# Patient Record
Sex: Male | Born: 1940 | Race: Black or African American | Hispanic: No | Marital: Married
Health system: Southern US, Community
[De-identification: ages and names within clinical notes are randomized; demographics above are authoritative.]

## PROBLEM LIST (undated history)

## (undated) DIAGNOSIS — F419 Anxiety disorder, unspecified: Secondary | ICD-10-CM

## (undated) DIAGNOSIS — I5030 Unspecified diastolic (congestive) heart failure: Secondary | ICD-10-CM

## (undated) DIAGNOSIS — M109 Gout, unspecified: Secondary | ICD-10-CM

## (undated) DIAGNOSIS — K219 Gastro-esophageal reflux disease without esophagitis: Secondary | ICD-10-CM

## (undated) DIAGNOSIS — N189 Chronic kidney disease, unspecified: Secondary | ICD-10-CM

## (undated) DIAGNOSIS — I10 Essential (primary) hypertension: Secondary | ICD-10-CM

## (undated) DIAGNOSIS — E119 Type 2 diabetes mellitus without complications: Secondary | ICD-10-CM

## (undated) DIAGNOSIS — D472 Monoclonal gammopathy: Secondary | ICD-10-CM

## (undated) DIAGNOSIS — D649 Anemia, unspecified: Secondary | ICD-10-CM

---

## 2005-12-29 ENCOUNTER — Emergency Department: Payer: Self-pay | Admitting: Unknown Physician Specialty

## 2006-02-19 ENCOUNTER — Emergency Department: Payer: Self-pay | Admitting: Unknown Physician Specialty

## 2006-02-19 ENCOUNTER — Other Ambulatory Visit: Payer: Self-pay

## 2009-02-19 ENCOUNTER — Emergency Department: Payer: Self-pay | Admitting: Emergency Medicine

## 2010-10-23 ENCOUNTER — Emergency Department: Payer: Self-pay | Admitting: Emergency Medicine

## 2010-11-09 ENCOUNTER — Inpatient Hospital Stay: Payer: Self-pay | Admitting: Internal Medicine

## 2010-11-12 ENCOUNTER — Ambulatory Visit: Payer: Self-pay | Admitting: Oncology

## 2010-12-12 ENCOUNTER — Ambulatory Visit: Payer: Self-pay | Admitting: Oncology

## 2010-12-13 ENCOUNTER — Ambulatory Visit: Payer: Self-pay | Admitting: Family Medicine

## 2010-12-18 ENCOUNTER — Ambulatory Visit: Payer: Self-pay | Admitting: Oncology

## 2010-12-27 ENCOUNTER — Ambulatory Visit: Payer: Self-pay | Admitting: Vascular Surgery

## 2011-01-03 ENCOUNTER — Ambulatory Visit: Payer: Self-pay | Admitting: Vascular Surgery

## 2011-01-12 ENCOUNTER — Ambulatory Visit: Payer: Self-pay | Admitting: Oncology

## 2011-01-29 ENCOUNTER — Ambulatory Visit: Payer: Self-pay | Admitting: Vascular Surgery

## 2011-02-12 ENCOUNTER — Emergency Department: Payer: Self-pay | Admitting: Emergency Medicine

## 2011-03-01 ENCOUNTER — Ambulatory Visit: Payer: Self-pay | Admitting: Oncology

## 2011-03-04 ENCOUNTER — Inpatient Hospital Stay: Payer: Self-pay | Admitting: Internal Medicine

## 2011-03-14 ENCOUNTER — Ambulatory Visit: Payer: Self-pay | Admitting: Oncology

## 2011-04-17 ENCOUNTER — Ambulatory Visit: Payer: Self-pay | Admitting: Internal Medicine

## 2011-06-07 ENCOUNTER — Ambulatory Visit: Payer: Self-pay | Admitting: Rheumatology

## 2011-08-07 ENCOUNTER — Other Ambulatory Visit: Payer: Self-pay | Admitting: Family Medicine

## 2011-08-13 ENCOUNTER — Emergency Department: Payer: Self-pay | Admitting: Emergency Medicine

## 2011-11-25 ENCOUNTER — Ambulatory Visit: Payer: Self-pay | Admitting: Family Medicine

## 2011-12-01 ENCOUNTER — Other Ambulatory Visit: Payer: Self-pay | Admitting: Family Medicine

## 2011-12-01 LAB — BASIC METABOLIC PANEL
BUN: 51 mg/dL — ABNORMAL HIGH (ref 7–18)
Calcium, Total: 8.8 mg/dL (ref 8.5–10.1)
Chloride: 107 mmol/L (ref 98–107)
Co2: 24 mmol/L (ref 21–32)
Creatinine: 2.15 mg/dL — ABNORMAL HIGH (ref 0.60–1.30)
EGFR (African American): 34 — ABNORMAL LOW
EGFR (Non-African Amer.): 30 — ABNORMAL LOW
Osmolality: 299 (ref 275–301)
Potassium: 3.9 mmol/L (ref 3.5–5.1)

## 2011-12-01 LAB — CBC WITH DIFFERENTIAL/PLATELET
Eosinophil #: 0 10*3/uL (ref 0.0–0.7)
Eosinophil %: 0.1 %
HCT: 37.4 % — ABNORMAL LOW (ref 40.0–52.0)
Lymphocyte #: 0.6 10*3/uL — ABNORMAL LOW (ref 1.0–3.6)
Lymphocyte %: 7.3 %
MCH: 29.3 pg (ref 26.0–34.0)
MCV: 93 fL (ref 80–100)
Monocyte %: 10.6 %
Neutrophil #: 6.8 10*3/uL — ABNORMAL HIGH (ref 1.4–6.5)
RBC: 4.02 10*6/uL — ABNORMAL LOW (ref 4.40–5.90)
RDW: 17.2 % — ABNORMAL HIGH (ref 11.5–14.5)
WBC: 8.4 10*3/uL (ref 3.8–10.6)

## 2011-12-22 ENCOUNTER — Other Ambulatory Visit: Payer: Self-pay | Admitting: Family Medicine

## 2011-12-22 LAB — CBC WITH DIFFERENTIAL/PLATELET
Basophil %: 0.5 %
Eosinophil #: 0 10*3/uL (ref 0.0–0.7)
Eosinophil %: 0.2 %
HCT: 38.1 % — ABNORMAL LOW (ref 40.0–52.0)
HGB: 12.4 g/dL — ABNORMAL LOW (ref 13.0–18.0)
Lymphocyte %: 12 %
Monocyte %: 11.7 %
Neutrophil #: 5.7 10*3/uL (ref 1.4–6.5)
Neutrophil %: 75.6 %
Platelet: 254 10*3/uL (ref 150–440)
RDW: 16.8 % — ABNORMAL HIGH (ref 11.5–14.5)
WBC: 7.6 10*3/uL (ref 3.8–10.6)

## 2011-12-22 LAB — RENAL FUNCTION PANEL
Albumin: 3 g/dL — ABNORMAL LOW (ref 3.4–5.0)
Anion Gap: 11 (ref 7–16)
BUN: 76 mg/dL — ABNORMAL HIGH (ref 7–18)
EGFR (African American): 23 — ABNORMAL LOW
EGFR (Non-African Amer.): 20 — ABNORMAL LOW
Glucose: 91 mg/dL (ref 65–99)
Phosphorus: 4.4 mg/dL (ref 2.5–4.9)
Potassium: 4.6 mmol/L (ref 3.5–5.1)
Sodium: 137 mmol/L (ref 136–145)

## 2011-12-26 ENCOUNTER — Emergency Department: Payer: Self-pay | Admitting: Emergency Medicine

## 2011-12-26 LAB — CBC WITH DIFFERENTIAL/PLATELET
Basophil #: 0 10*3/uL (ref 0.0–0.1)
Eosinophil %: 2.5 %
HCT: 36 % — ABNORMAL LOW (ref 40.0–52.0)
HGB: 11.6 g/dL — ABNORMAL LOW (ref 13.0–18.0)
Lymphocyte %: 19.6 %
Monocyte #: 0.6 x10 3/mm (ref 0.2–1.0)
Monocyte %: 10.8 %
Neutrophil #: 3.6 10*3/uL (ref 1.4–6.5)
Neutrophil %: 66.5 %
RBC: 4.03 10*6/uL — ABNORMAL LOW (ref 4.40–5.90)
WBC: 5.4 10*3/uL (ref 3.8–10.6)

## 2011-12-26 LAB — URINALYSIS, COMPLETE
Glucose,UR: NEGATIVE mg/dL (ref 0–75)
Ketone: NEGATIVE
Leukocyte Esterase: NEGATIVE
Nitrite: NEGATIVE
Ph: 5 (ref 4.5–8.0)
Specific Gravity: 1.015 (ref 1.003–1.030)
Squamous Epithelial: 1

## 2011-12-26 LAB — COMPREHENSIVE METABOLIC PANEL
Albumin: 2.8 g/dL — ABNORMAL LOW (ref 3.4–5.0)
Anion Gap: 9 (ref 7–16)
Bilirubin,Total: 0.3 mg/dL (ref 0.2–1.0)
Chloride: 107 mmol/L (ref 98–107)
Creatinine: 3.16 mg/dL — ABNORMAL HIGH (ref 0.60–1.30)
EGFR (Non-African Amer.): 19 — ABNORMAL LOW
Glucose: 104 mg/dL — ABNORMAL HIGH (ref 65–99)
Osmolality: 301 (ref 275–301)

## 2011-12-28 LAB — URINE CULTURE

## 2012-01-12 ENCOUNTER — Ambulatory Visit: Payer: Self-pay | Admitting: Oncology

## 2012-02-03 LAB — COMPREHENSIVE METABOLIC PANEL
Alkaline Phosphatase: 79 U/L (ref 50–136)
Anion Gap: 14 (ref 7–16)
BUN: 103 mg/dL — ABNORMAL HIGH (ref 7–18)
Bilirubin,Total: 1 mg/dL (ref 0.2–1.0)
Creatinine: 4.88 mg/dL — ABNORMAL HIGH (ref 0.60–1.30)
Glucose: 80 mg/dL (ref 65–99)
Osmolality: 309 (ref 275–301)
Potassium: 4.6 mmol/L (ref 3.5–5.1)
SGOT(AST): 26 U/L (ref 15–37)
SGPT (ALT): 14 U/L
Sodium: 139 mmol/L (ref 136–145)

## 2012-02-03 LAB — CBC
MCHC: 30.6 g/dL — ABNORMAL LOW (ref 32.0–36.0)
RBC: 3.7 10*6/uL — ABNORMAL LOW (ref 4.40–5.90)
RDW: 18.6 % — ABNORMAL HIGH (ref 11.5–14.5)

## 2012-02-03 LAB — URINALYSIS, COMPLETE
Bacteria: NONE SEEN
Squamous Epithelial: NONE SEEN
WBC UR: 505 /HPF (ref 0–5)

## 2012-02-03 LAB — CK TOTAL AND CKMB (NOT AT ARMC)
CK, Total: 265 U/L — ABNORMAL HIGH (ref 35–232)
CK-MB: 2.8 ng/mL (ref 0.5–3.6)

## 2012-02-03 LAB — PRO B NATRIURETIC PEPTIDE: B-Type Natriuretic Peptide: 11602 pg/mL — ABNORMAL HIGH (ref 0–125)

## 2012-02-04 ENCOUNTER — Inpatient Hospital Stay: Payer: Self-pay | Admitting: Family Medicine

## 2012-02-04 LAB — TROPONIN I: Troponin-I: 0.07 ng/mL — ABNORMAL HIGH

## 2012-02-04 LAB — CBC WITH DIFFERENTIAL/PLATELET
Eosinophil #: 0 10*3/uL (ref 0.0–0.7)
Eosinophil %: 0.1 %
HCT: 28.9 % — ABNORMAL LOW (ref 40.0–52.0)
HGB: 8.9 g/dL — ABNORMAL LOW (ref 13.0–18.0)
Lymphocyte #: 0.2 10*3/uL — ABNORMAL LOW (ref 1.0–3.6)
Lymphocyte %: 2.2 %
MCHC: 30.6 g/dL — ABNORMAL LOW (ref 32.0–36.0)
MCV: 89 fL (ref 80–100)
Monocyte %: 3 %
Neutrophil %: 94.2 %
Platelet: 108 10*3/uL — ABNORMAL LOW (ref 150–440)
RBC: 3.26 10*6/uL — ABNORMAL LOW (ref 4.40–5.90)
RDW: 18.5 % — ABNORMAL HIGH (ref 11.5–14.5)
WBC: 10.7 10*3/uL — ABNORMAL HIGH (ref 3.8–10.6)

## 2012-02-04 LAB — URINALYSIS, COMPLETE
RBC,UR: 3805 /HPF (ref 0–5)
WBC UR: 2201 /HPF (ref 0–5)

## 2012-02-04 LAB — BASIC METABOLIC PANEL
Anion Gap: 14 (ref 7–16)
BUN: 104 mg/dL — ABNORMAL HIGH (ref 7–18)
Calcium, Total: 7.6 mg/dL — ABNORMAL LOW (ref 8.5–10.1)
Chloride: 107 mmol/L (ref 98–107)
Chloride: 104 mmol/L (ref 98–107)
Co2: 17 mmol/L — ABNORMAL LOW (ref 21–32)
Co2: 24 mmol/L (ref 21–32)
Creatinine: 5.44 mg/dL — ABNORMAL HIGH (ref 0.60–1.30)
EGFR (Non-African Amer.): 10 — ABNORMAL LOW
EGFR (Non-African Amer.): 10 — ABNORMAL LOW
Glucose: 84 mg/dL (ref 65–99)
Glucose: 155 mg/dL — ABNORMAL HIGH (ref 65–99)
Osmolality: 307 (ref 275–301)
Potassium: 5.7 mmol/L — ABNORMAL HIGH (ref 3.5–5.1)
Sodium: 138 mmol/L (ref 136–145)

## 2012-02-04 LAB — TSH: Thyroid Stimulating Horm: 2.4 u[IU]/mL

## 2012-02-05 LAB — MAGNESIUM
Magnesium: 1.5 mg/dL — ABNORMAL LOW
Magnesium: 1.8 mg/dL

## 2012-02-05 LAB — CBC WITH DIFFERENTIAL/PLATELET
Basophil %: 1.1 %
Eosinophil #: 0 10*3/uL (ref 0.0–0.7)
HCT: 31 % — ABNORMAL LOW (ref 40.0–52.0)
MCH: 27.2 pg (ref 26.0–34.0)
MCV: 90 fL (ref 80–100)
Monocyte #: 0.9 x10 3/mm (ref 0.2–1.0)
Neutrophil %: 92.3 %
WBC: 18.1 10*3/uL — ABNORMAL HIGH (ref 3.8–10.6)

## 2012-02-05 LAB — BASIC METABOLIC PANEL
Anion Gap: 7 (ref 7–16)
Anion Gap: 8 (ref 7–16)
Anion Gap: 9 (ref 7–16)
BUN: 48 mg/dL — ABNORMAL HIGH (ref 7–18)
Calcium, Total: 7.9 mg/dL — ABNORMAL LOW (ref 8.5–10.1)
Chloride: 102 mmol/L (ref 98–107)
Chloride: 103 mmol/L (ref 98–107)
Co2: 24 mmol/L (ref 21–32)
Co2: 24 mmol/L (ref 21–32)
Co2: 27 mmol/L (ref 21–32)
Creatinine: 4.47 mg/dL — ABNORMAL HIGH (ref 0.60–1.30)
EGFR (African American): 14 — ABNORMAL LOW
EGFR (African American): 17 — ABNORMAL LOW
EGFR (African American): 23 — ABNORMAL LOW
EGFR (Non-African Amer.): 12 — ABNORMAL LOW
Glucose: 103 mg/dL — ABNORMAL HIGH (ref 65–99)
Glucose: 89 mg/dL (ref 65–99)
Glucose: 99 mg/dL (ref 65–99)
Osmolality: 289 (ref 275–301)
Potassium: 4 mmol/L (ref 3.5–5.1)
Potassium: 4.7 mmol/L (ref 3.5–5.1)
Potassium: 5 mmol/L (ref 3.5–5.1)
Sodium: 134 mmol/L — ABNORMAL LOW (ref 136–145)
Sodium: 136 mmol/L (ref 136–145)
Sodium: 137 mmol/L (ref 136–145)

## 2012-02-05 LAB — LIPID PANEL
HDL Cholesterol: 18 mg/dL — ABNORMAL LOW (ref 40–60)
Ldl Cholesterol, Calc: 41 mg/dL (ref 0–100)
Triglycerides: 157 mg/dL (ref 0–200)
VLDL Cholesterol, Calc: 31 mg/dL (ref 5–40)

## 2012-02-05 LAB — PHOSPHORUS: Phosphorus: 2.5 mg/dL (ref 2.5–4.9)

## 2012-02-06 LAB — CBC WITH DIFFERENTIAL/PLATELET
Basophil #: 0 10*3/uL (ref 0.0–0.1)
Basophil %: 0 %
Eosinophil #: 0.4 10*3/uL (ref 0.0–0.7)
Eosinophil %: 1.9 %
HGB: 9.3 g/dL — ABNORMAL LOW (ref 13.0–18.0)
Lymphocyte #: 0.3 10*3/uL — ABNORMAL LOW (ref 1.0–3.6)
Lymphocyte %: 1.2 %
Monocyte #: 0.7 x10 3/mm (ref 0.2–1.0)
Neutrophil #: 21.3 10*3/uL — ABNORMAL HIGH (ref 1.4–6.5)
Neutrophil %: 93.9 %
RDW: 18.6 % — ABNORMAL HIGH (ref 11.5–14.5)
WBC: 22.7 10*3/uL — ABNORMAL HIGH (ref 3.8–10.6)

## 2012-02-06 LAB — BASIC METABOLIC PANEL
Anion Gap: 7 (ref 7–16)
BUN: 35 mg/dL — ABNORMAL HIGH (ref 7–18)
BUN: 31 mg/dL — ABNORMAL HIGH (ref 7–18)
Calcium, Total: 7.8 mg/dL — ABNORMAL LOW (ref 8.5–10.1)
Co2: 28 mmol/L (ref 21–32)
Creatinine: 2.36 mg/dL — ABNORMAL HIGH (ref 0.60–1.30)
EGFR (Non-African Amer.): 27 — ABNORMAL LOW
EGFR (Non-African Amer.): 30 — ABNORMAL LOW
Osmolality: 286 (ref 275–301)
Potassium: 3.6 mmol/L (ref 3.5–5.1)
Sodium: 138 mmol/L (ref 136–145)
Sodium: 136 mmol/L (ref 136–145)

## 2012-02-06 LAB — FIBRINOGEN: Fibrinogen: 739 mg/dL — ABNORMAL HIGH (ref 210–470)

## 2012-02-06 LAB — PROTIME-INR: Prothrombin Time: 60.3 secs — ABNORMAL HIGH (ref 11.5–14.7)

## 2012-02-06 LAB — PHOSPHORUS: Phosphorus: 1.6 mg/dL — ABNORMAL LOW (ref 2.5–4.9)

## 2012-02-06 LAB — CULTURE, BLOOD (SINGLE)

## 2012-02-06 LAB — URINE CULTURE

## 2012-02-06 LAB — MAGNESIUM
Magnesium: 1.5 mg/dL — ABNORMAL LOW
Magnesium: 1.6 mg/dL — ABNORMAL LOW

## 2012-02-07 LAB — CBC WITH DIFFERENTIAL/PLATELET
Basophil %: 0.3 %
HCT: 30.4 % — ABNORMAL LOW (ref 40.0–52.0)
HGB: 9.4 g/dL — ABNORMAL LOW (ref 13.0–18.0)
Lymphocyte #: 6.6 10*3/uL — ABNORMAL HIGH (ref 1.0–3.6)
Lymphocyte %: 28.9 %
MCH: 27 pg (ref 26.0–34.0)
MCHC: 30.9 g/dL — ABNORMAL LOW (ref 32.0–36.0)
Monocyte #: 0.4 x10 3/mm (ref 0.2–1.0)
Neutrophil #: 15.7 10*3/uL — ABNORMAL HIGH (ref 1.4–6.5)
RBC: 3.49 10*6/uL — ABNORMAL LOW (ref 4.40–5.90)
RDW: 18.8 % — ABNORMAL HIGH (ref 11.5–14.5)
WBC: 23 10*3/uL — ABNORMAL HIGH (ref 3.8–10.6)

## 2012-02-07 LAB — BASIC METABOLIC PANEL
Anion Gap: 8 (ref 7–16)
Anion Gap: 7 (ref 7–16)
Calcium, Total: 8.1 mg/dL — ABNORMAL LOW (ref 8.5–10.1)
Calcium, Total: 8.1 mg/dL — ABNORMAL LOW (ref 8.5–10.1)
Chloride: 101 mmol/L (ref 98–107)
Co2: 27 mmol/L (ref 21–32)
Creatinine: 2.22 mg/dL — ABNORMAL HIGH (ref 0.60–1.30)
Creatinine: 2.04 mg/dL — ABNORMAL HIGH (ref 0.60–1.30)
EGFR (African American): 37 — ABNORMAL LOW
EGFR (Non-African Amer.): 29 — ABNORMAL LOW
EGFR (Non-African Amer.): 32 — ABNORMAL LOW
Glucose: 185 mg/dL — ABNORMAL HIGH (ref 65–99)
Glucose: 165 mg/dL — ABNORMAL HIGH (ref 65–99)
Osmolality: 283 (ref 275–301)
Osmolality: 281 (ref 275–301)
Potassium: 3.7 mmol/L (ref 3.5–5.1)
Sodium: 136 mmol/L (ref 136–145)
Sodium: 135 mmol/L — ABNORMAL LOW (ref 136–145)

## 2012-02-07 LAB — MAGNESIUM
Magnesium: 1.8 mg/dL
Magnesium: 1.8 mg/dL

## 2012-02-07 LAB — PROTIME-INR: INR: 3.9

## 2012-02-08 LAB — COMPREHENSIVE METABOLIC PANEL
Albumin: 1.9 g/dL — ABNORMAL LOW (ref 3.4–5.0)
Alkaline Phosphatase: 208 U/L — ABNORMAL HIGH (ref 50–136)
Anion Gap: 6 — ABNORMAL LOW (ref 7–16)
BUN: 37 mg/dL — ABNORMAL HIGH (ref 7–18)
Bilirubin,Total: 1.9 mg/dL — ABNORMAL HIGH (ref 0.2–1.0)
Creatinine: 2.01 mg/dL — ABNORMAL HIGH (ref 0.60–1.30)
EGFR (African American): 37 — ABNORMAL LOW
EGFR (Non-African Amer.): 32 — ABNORMAL LOW
Glucose: 213 mg/dL — ABNORMAL HIGH (ref 65–99)
Osmolality: 287 (ref 275–301)
Potassium: 3.9 mmol/L (ref 3.5–5.1)
Sodium: 136 mmol/L (ref 136–145)
Total Protein: 5.7 g/dL — ABNORMAL LOW (ref 6.4–8.2)

## 2012-02-08 LAB — CBC WITH DIFFERENTIAL/PLATELET
Basophil #: 0 10*3/uL (ref 0.0–0.1)
Basophil %: 0.1 %
HCT: 27.8 % — ABNORMAL LOW (ref 40.0–52.0)
Lymphocyte %: 3.4 %
MCHC: 31.2 g/dL — ABNORMAL LOW (ref 32.0–36.0)
Monocyte #: 0.7 x10 3/mm (ref 0.2–1.0)
Neutrophil #: 20.4 10*3/uL — ABNORMAL HIGH (ref 1.4–6.5)
Neutrophil %: 93 %
RBC: 3.23 10*6/uL — ABNORMAL LOW (ref 4.40–5.90)
WBC: 21.9 10*3/uL — ABNORMAL HIGH (ref 3.8–10.6)

## 2012-02-08 LAB — MAGNESIUM: Magnesium: 2.1 mg/dL

## 2012-02-08 LAB — VANCOMYCIN, TROUGH: Vancomycin, Trough: 19 ug/mL (ref 10–20)

## 2012-02-08 LAB — PHOSPHORUS: Phosphorus: 1.7 mg/dL — ABNORMAL LOW (ref 2.5–4.9)

## 2012-02-09 LAB — CBC WITH DIFFERENTIAL/PLATELET
Basophil #: 0 10*3/uL (ref 0.0–0.1)
Basophil %: 0.1 %
Eosinophil %: 0.3 %
HCT: 24.8 % — ABNORMAL LOW (ref 40.0–52.0)
HGB: 7.7 g/dL — ABNORMAL LOW (ref 13.0–18.0)
Lymphocyte #: 0.5 10*3/uL — ABNORMAL LOW (ref 1.0–3.6)
Lymphocyte %: 3.1 %
MCH: 26.9 pg (ref 26.0–34.0)
MCHC: 30.9 g/dL — ABNORMAL LOW (ref 32.0–36.0)
MCV: 87 fL (ref 80–100)
Monocyte %: 5.3 %
Neutrophil #: 15.3 10*3/uL — ABNORMAL HIGH (ref 1.4–6.5)
Neutrophil %: 91.2 %
Platelet: 69 10*3/uL — ABNORMAL LOW (ref 150–440)
RBC: 2.85 10*6/uL — ABNORMAL LOW (ref 4.40–5.90)

## 2012-02-09 LAB — BASIC METABOLIC PANEL
BUN: 17 mg/dL (ref 7–18)
Chloride: 99 mmol/L (ref 98–107)
Co2: 31 mmol/L (ref 21–32)
Creatinine: 1.16 mg/dL (ref 0.60–1.30)
EGFR (Non-African Amer.): >60
Glucose: 213 mg/dL — ABNORMAL HIGH (ref 65–99)
Potassium: 3.4 mmol/L — ABNORMAL LOW (ref 3.5–5.1)
Sodium: 139 mmol/L (ref 136–145)

## 2012-02-09 LAB — RENAL FUNCTION PANEL
Albumin: 2.3 g/dL — ABNORMAL LOW (ref 3.4–5.0)
Anion Gap: 8 (ref 7–16)
Chloride: 98 mmol/L (ref 98–107)
EGFR (African American): 33 — ABNORMAL LOW
Glucose: 304 mg/dL — ABNORMAL HIGH (ref 65–99)
Osmolality: 291 (ref 275–301)
Phosphorus: 2.4 mg/dL — ABNORMAL LOW (ref 2.5–4.9)
Potassium: 3.7 mmol/L (ref 3.5–5.1)

## 2012-02-09 LAB — PLATELET COUNT: Platelet: 68 10*3/uL — ABNORMAL LOW (ref 150–440)

## 2012-02-09 LAB — FOLATE: Folic Acid: 4.2 ng/mL (ref 3.1–100.0)

## 2012-02-09 LAB — IRON AND TIBC
Iron Bind.Cap.(Total): 169 ug/dL — ABNORMAL LOW (ref 250–450)
Iron: 56 ug/dL — ABNORMAL LOW (ref 65–175)
Unbound Iron-Bind.Cap.: 113 ug/dL

## 2012-02-09 LAB — FERRITIN: Ferritin (ARMC): 278 ng/mL (ref 8–388)

## 2012-02-09 LAB — PROTIME-INR: INR: 1.2

## 2012-02-10 LAB — BASIC METABOLIC PANEL
Anion Gap: 7 (ref 7–16)
Calcium, Total: 8 mg/dL — ABNORMAL LOW (ref 8.5–10.1)
EGFR (African American): 27 — ABNORMAL LOW
EGFR (Non-African Amer.): 23 — ABNORMAL LOW
Glucose: 212 mg/dL — ABNORMAL HIGH (ref 65–99)
Potassium: 3.5 mmol/L (ref 3.5–5.1)

## 2012-02-10 LAB — CBC WITH DIFFERENTIAL/PLATELET
Basophil #: 0 10*3/uL (ref 0.0–0.1)
Basophil %: 0.1 %
Eosinophil %: 0 %
HCT: 25.9 % — ABNORMAL LOW (ref 40.0–52.0)
HGB: 8.1 g/dL — ABNORMAL LOW (ref 13.0–18.0)
Lymphocyte %: 4 %
MCHC: 31.3 g/dL — ABNORMAL LOW (ref 32.0–36.0)
MCV: 87 fL (ref 80–100)
Monocyte #: 1.3 x10 3/mm — ABNORMAL HIGH (ref 0.2–1.0)
Neutrophil %: 87.7 %
RBC: 2.98 10*6/uL — ABNORMAL LOW (ref 4.40–5.90)
WBC: 16.4 10*3/uL — ABNORMAL HIGH (ref 3.8–10.6)

## 2012-02-10 LAB — PHOSPHORUS: Phosphorus: 3.1 mg/dL (ref 2.5–4.9)

## 2012-02-10 LAB — ALBUMIN: Albumin: 2.6 g/dL — ABNORMAL LOW (ref 3.4–5.0)

## 2012-02-11 ENCOUNTER — Ambulatory Visit: Payer: Self-pay | Admitting: Oncology

## 2012-02-11 LAB — COMPREHENSIVE METABOLIC PANEL
Anion Gap: 7 (ref 7–16)
BUN: 84 mg/dL — ABNORMAL HIGH (ref 7–18)
Bilirubin,Total: 0.8 mg/dL (ref 0.2–1.0)
Creatinine: 3.45 mg/dL — ABNORMAL HIGH (ref 0.60–1.30)
EGFR (African American): 19 — ABNORMAL LOW
Glucose: 207 mg/dL — ABNORMAL HIGH (ref 65–99)
SGPT (ALT): 23 U/L
Sodium: 139 mmol/L (ref 136–145)
Total Protein: 5.8 g/dL — ABNORMAL LOW (ref 6.4–8.2)

## 2012-02-11 LAB — CBC WITH DIFFERENTIAL/PLATELET
Basophil %: 0 %
HCT: 25.9 % — ABNORMAL LOW (ref 40.0–52.0)
HGB: 8.1 g/dL — ABNORMAL LOW (ref 13.0–18.0)
Lymphocyte %: 4.4 %
MCHC: 31.4 g/dL — ABNORMAL LOW (ref 32.0–36.0)
Monocyte %: 5.7 %
Neutrophil #: 13.8 10*3/uL — ABNORMAL HIGH (ref 1.4–6.5)
Neutrophil %: 89.8 %
RBC: 2.98 10*6/uL — ABNORMAL LOW (ref 4.40–5.90)
WBC: 15.4 10*3/uL — ABNORMAL HIGH (ref 3.8–10.6)

## 2012-02-12 LAB — BASIC METABOLIC PANEL
Calcium, Total: 8 mg/dL — ABNORMAL LOW (ref 8.5–10.1)
Co2: 31 mmol/L (ref 21–32)
EGFR (Non-African Amer.): 18 — ABNORMAL LOW
Glucose: 279 mg/dL — ABNORMAL HIGH (ref 65–99)
Osmolality: 308 (ref 275–301)
Potassium: 4 mmol/L (ref 3.5–5.1)
Sodium: 138 mmol/L (ref 136–145)

## 2012-02-12 LAB — CBC WITH DIFFERENTIAL/PLATELET
Basophil %: 0.1 %
Eosinophil #: 0 10*3/uL (ref 0.0–0.7)
HCT: 25 % — ABNORMAL LOW (ref 40.0–52.0)
HGB: 7.8 g/dL — ABNORMAL LOW (ref 13.0–18.0)
Lymphocyte #: 0.8 10*3/uL — ABNORMAL LOW (ref 1.0–3.6)
Lymphocyte %: 5.7 %
MCHC: 31.3 g/dL — ABNORMAL LOW (ref 32.0–36.0)
MCV: 87 fL (ref 80–100)
Neutrophil #: 12.4 10*3/uL — ABNORMAL HIGH (ref 1.4–6.5)

## 2012-02-13 LAB — CBC WITH DIFFERENTIAL/PLATELET
Basophil %: 0.2 %
Eosinophil #: 0 10*3/uL (ref 0.0–0.7)
Eosinophil %: 0.4 %
HCT: 25.4 % — ABNORMAL LOW (ref 40.0–52.0)
HGB: 8 g/dL — ABNORMAL LOW (ref 13.0–18.0)
Lymphocyte %: 10.8 %
MCHC: 31.5 g/dL — ABNORMAL LOW (ref 32.0–36.0)
Monocyte %: 8.3 %
Neutrophil #: 7.7 10*3/uL — ABNORMAL HIGH (ref 1.4–6.5)
Neutrophil %: 80.3 %
RBC: 2.9 10*6/uL — ABNORMAL LOW (ref 4.40–5.90)

## 2012-02-13 LAB — BASIC METABOLIC PANEL
Anion Gap: 9 (ref 7–16)
Calcium, Total: 7.9 mg/dL — ABNORMAL LOW (ref 8.5–10.1)
Co2: 30 mmol/L (ref 21–32)
EGFR (African American): 16 — ABNORMAL LOW
EGFR (Non-African Amer.): 14 — ABNORMAL LOW
Sodium: 137 mmol/L (ref 136–145)

## 2012-02-14 LAB — BASIC METABOLIC PANEL
Chloride: 97 mmol/L — ABNORMAL LOW (ref 98–107)
Co2: 31 mmol/L (ref 21–32)
Creatinine: 3.68 mg/dL — ABNORMAL HIGH (ref 0.60–1.30)
Potassium: 4.2 mmol/L (ref 3.5–5.1)
Sodium: 136 mmol/L (ref 136–145)

## 2012-02-14 LAB — CBC WITH DIFFERENTIAL/PLATELET
Eosinophil %: 0.2 %
MCV: 88 fL (ref 80–100)
Monocyte %: 8.2 %
Neutrophil %: 78.7 %
Platelet: 188 10*3/uL (ref 150–440)
RBC: 2.69 10*6/uL — ABNORMAL LOW (ref 4.40–5.90)
WBC: 9.1 10*3/uL (ref 3.8–10.6)

## 2012-02-15 LAB — CBC WITH DIFFERENTIAL/PLATELET
Basophil %: 0.1 %
HCT: 23.5 % — ABNORMAL LOW (ref 40.0–52.0)
HGB: 7.6 g/dL — ABNORMAL LOW (ref 13.0–18.0)
Lymphocyte #: 0.9 10*3/uL — ABNORMAL LOW (ref 1.0–3.6)
Lymphocyte %: 10.4 %
MCH: 27.9 pg (ref 26.0–34.0)
MCHC: 32.4 g/dL (ref 32.0–36.0)
MCV: 86 fL (ref 80–100)
Neutrophil #: 7.1 10*3/uL — ABNORMAL HIGH (ref 1.4–6.5)
RDW: 18.6 % — ABNORMAL HIGH (ref 11.5–14.5)

## 2012-02-15 LAB — COMPREHENSIVE METABOLIC PANEL
Alkaline Phosphatase: 99 U/L (ref 50–136)
Anion Gap: 11 (ref 7–16)
Bilirubin,Total: 1.1 mg/dL — ABNORMAL HIGH (ref 0.2–1.0)
Calcium, Total: 8.1 mg/dL — ABNORMAL LOW (ref 8.5–10.1)
Chloride: 96 mmol/L — ABNORMAL LOW (ref 98–107)
Co2: 28 mmol/L (ref 21–32)
EGFR (African American): 15 — ABNORMAL LOW
EGFR (Non-African Amer.): 13 — ABNORMAL LOW
Osmolality: 293 (ref 275–301)
Sodium: 135 mmol/L — ABNORMAL LOW (ref 136–145)

## 2012-02-16 LAB — BASIC METABOLIC PANEL
Anion Gap: 6 — ABNORMAL LOW (ref 7–16)
BUN: 56 mg/dL — ABNORMAL HIGH (ref 7–18)
Co2: 31 mmol/L (ref 21–32)
Creatinine: 3.65 mg/dL — ABNORMAL HIGH (ref 0.60–1.30)
EGFR (African American): 18 — ABNORMAL LOW
EGFR (Non-African Amer.): 16 — ABNORMAL LOW
Glucose: 109 mg/dL — ABNORMAL HIGH (ref 65–99)
Sodium: 135 mmol/L — ABNORMAL LOW (ref 136–145)

## 2012-02-16 LAB — CBC WITH DIFFERENTIAL/PLATELET
Basophil #: 0 10*3/uL (ref 0.0–0.1)
Eosinophil %: 0.7 %
Lymphocyte #: 1 10*3/uL (ref 1.0–3.6)
MCH: 27.1 pg (ref 26.0–34.0)
Neutrophil #: 5.8 10*3/uL (ref 1.4–6.5)
Neutrophil %: 76.3 %
Platelet: 249 10*3/uL (ref 150–440)
RBC: 2.99 10*6/uL — ABNORMAL LOW (ref 4.40–5.90)
RDW: 18.8 % — ABNORMAL HIGH (ref 11.5–14.5)

## 2012-02-17 LAB — CLOSTRIDIUM DIFFICILE BY PCR

## 2012-02-17 LAB — CBC WITH DIFFERENTIAL/PLATELET
Basophil %: 0.5 %
Eosinophil %: 0.9 %
HCT: 25.2 % — ABNORMAL LOW (ref 40.0–52.0)
HGB: 8.1 g/dL — ABNORMAL LOW (ref 13.0–18.0)
Lymphocyte %: 13.3 %
MCH: 27.2 pg (ref 26.0–34.0)
Neutrophil #: 5.7 10*3/uL (ref 1.4–6.5)
Neutrophil %: 75.8 %
RBC: 2.96 10*6/uL — ABNORMAL LOW (ref 4.40–5.90)

## 2012-02-17 LAB — BASIC METABOLIC PANEL
Calcium, Total: 7.9 mg/dL — ABNORMAL LOW (ref 8.5–10.1)
Chloride: 98 mmol/L (ref 98–107)
Glucose: 145 mg/dL — ABNORMAL HIGH (ref 65–99)
Osmolality: 293 (ref 275–301)
Potassium: 3.6 mmol/L (ref 3.5–5.1)

## 2012-02-18 LAB — CBC WITH DIFFERENTIAL/PLATELET
Basophil #: 0 10*3/uL (ref 0.0–0.1)
HGB: 7.8 g/dL — ABNORMAL LOW (ref 13.0–18.0)
Lymphocyte %: 11.4 %
MCV: 86 fL (ref 80–100)
Monocyte %: 7.9 %
Platelet: 281 10*3/uL (ref 150–440)
RBC: 2.86 10*6/uL — ABNORMAL LOW (ref 4.40–5.90)
RDW: 18.3 % — ABNORMAL HIGH (ref 11.5–14.5)

## 2012-02-18 LAB — BASIC METABOLIC PANEL
BUN: 72 mg/dL — ABNORMAL HIGH (ref 7–18)
Chloride: 99 mmol/L (ref 98–107)
Creatinine: 4.18 mg/dL — ABNORMAL HIGH (ref 0.60–1.30)
EGFR (Non-African Amer.): 13 — ABNORMAL LOW
Glucose: 115 mg/dL — ABNORMAL HIGH (ref 65–99)
Potassium: 3.1 mmol/L — ABNORMAL LOW (ref 3.5–5.1)
Sodium: 136 mmol/L (ref 136–145)

## 2012-02-18 LAB — CREATININE CLEARANCE, URINE, 24 HOUR: Total Volume: 1550 mL

## 2012-02-19 LAB — CBC WITH DIFFERENTIAL/PLATELET
Basophil %: 0.4 %
Eosinophil #: 0 10*3/uL (ref 0.0–0.7)
Eosinophil %: 0.5 %
HCT: 24.6 % — ABNORMAL LOW (ref 40.0–52.0)
HGB: 7.9 g/dL — ABNORMAL LOW (ref 13.0–18.0)
MCH: 27.5 pg (ref 26.0–34.0)
MCHC: 32.2 g/dL (ref 32.0–36.0)
Monocyte #: 0.7 x10 3/mm (ref 0.2–1.0)
Neutrophil #: 6.8 10*3/uL — ABNORMAL HIGH (ref 1.4–6.5)
RBC: 2.88 10*6/uL — ABNORMAL LOW (ref 4.40–5.90)
WBC: 8.8 10*3/uL (ref 3.8–10.6)

## 2012-02-19 LAB — BASIC METABOLIC PANEL
Anion Gap: 14 (ref 7–16)
Calcium, Total: 8.2 mg/dL — ABNORMAL LOW (ref 8.5–10.1)
Creatinine: 4 mg/dL — ABNORMAL HIGH (ref 0.60–1.30)
EGFR (African American): 16 — ABNORMAL LOW
EGFR (Non-African Amer.): 14 — ABNORMAL LOW
Glucose: 113 mg/dL — ABNORMAL HIGH (ref 65–99)
Sodium: 138 mmol/L (ref 136–145)

## 2012-02-19 LAB — PROTIME-INR
INR: 1.2
Prothrombin Time: 15.6 secs — ABNORMAL HIGH (ref 11.5–14.7)

## 2012-02-19 LAB — PHOSPHORUS: Phosphorus: 5.6 mg/dL — ABNORMAL HIGH (ref 2.5–4.9)

## 2012-02-20 LAB — CBC WITH DIFFERENTIAL/PLATELET
Basophil #: 0 10*3/uL (ref 0.0–0.1)
Eosinophil %: 0.5 %
HGB: 7.3 g/dL — ABNORMAL LOW (ref 13.0–18.0)
Lymphocyte #: 1.9 10*3/uL (ref 1.0–3.6)
Lymphocyte %: 24.1 %
MCH: 27.2 pg (ref 26.0–34.0)
Monocyte #: 0.8 x10 3/mm (ref 0.2–1.0)
Neutrophil %: 64.5 %
Platelet: 226 10*3/uL (ref 150–440)
RBC: 2.7 10*6/uL — ABNORMAL LOW (ref 4.40–5.90)
WBC: 8 10*3/uL (ref 3.8–10.6)

## 2012-02-20 LAB — BASIC METABOLIC PANEL
Anion Gap: 10 (ref 7–16)
BUN: 54 mg/dL — ABNORMAL HIGH (ref 7–18)
Calcium, Total: 7.8 mg/dL — ABNORMAL LOW (ref 8.5–10.1)
Chloride: 101 mmol/L (ref 98–107)
Co2: 30 mmol/L (ref 21–32)
Glucose: 98 mg/dL (ref 65–99)
Osmolality: 296 (ref 275–301)
Potassium: 3.4 mmol/L — ABNORMAL LOW (ref 3.5–5.1)

## 2012-02-21 LAB — CBC WITH DIFFERENTIAL/PLATELET
Basophil %: 0.8 %
Eosinophil #: 0 10*3/uL (ref 0.0–0.7)
Eosinophil %: 0.6 %
HGB: 7.6 g/dL — ABNORMAL LOW (ref 13.0–18.0)
MCH: 27.5 pg (ref 26.0–34.0)
MCHC: 31.6 g/dL — ABNORMAL LOW (ref 32.0–36.0)
MCV: 87 fL (ref 80–100)
Monocyte #: 0.6 x10 3/mm (ref 0.2–1.0)
Monocyte %: 10.5 %
Neutrophil %: 70.5 %
Platelet: 230 10*3/uL (ref 150–440)
RBC: 2.75 10*6/uL — ABNORMAL LOW (ref 4.40–5.90)
WBC: 6.2 10*3/uL (ref 3.8–10.6)

## 2012-02-21 LAB — BASIC METABOLIC PANEL
Anion Gap: 11 (ref 7–16)
BUN: 58 mg/dL — ABNORMAL HIGH (ref 7–18)
Calcium, Total: 8.4 mg/dL — ABNORMAL LOW (ref 8.5–10.1)
Chloride: 100 mmol/L (ref 98–107)
Co2: 29 mmol/L (ref 21–32)
EGFR (African American): 18 — ABNORMAL LOW
Potassium: 3.1 mmol/L — ABNORMAL LOW (ref 3.5–5.1)
Sodium: 140 mmol/L (ref 136–145)

## 2012-02-21 LAB — POTASSIUM: Potassium: 3 mmol/L — ABNORMAL LOW (ref 3.5–5.1)

## 2012-02-21 LAB — PHOSPHORUS: Phosphorus: 5.1 mg/dL — ABNORMAL HIGH (ref 2.5–4.9)

## 2012-02-21 LAB — MAGNESIUM: Magnesium: 1.8 mg/dL

## 2012-02-22 LAB — BASIC METABOLIC PANEL
BUN: 36 mg/dL — ABNORMAL HIGH (ref 7–18)
Calcium, Total: 8.1 mg/dL — ABNORMAL LOW (ref 8.5–10.1)
Creatinine: 2.75 mg/dL — ABNORMAL HIGH (ref 0.60–1.30)
EGFR (African American): 26 — ABNORMAL LOW
EGFR (Non-African Amer.): 22 — ABNORMAL LOW
Glucose: 102 mg/dL — ABNORMAL HIGH (ref 65–99)
Sodium: 143 mmol/L (ref 136–145)

## 2012-02-22 LAB — CBC WITH DIFFERENTIAL/PLATELET
Basophil #: 0 10*3/uL (ref 0.0–0.1)
Basophil %: 0.5 %
Eosinophil #: 0 10*3/uL (ref 0.0–0.7)
Eosinophil %: 0.7 %
HCT: 24.2 % — ABNORMAL LOW (ref 40.0–52.0)
HGB: 7.8 g/dL — ABNORMAL LOW (ref 13.0–18.0)
MCH: 27.7 pg (ref 26.0–34.0)
MCHC: 32.1 g/dL (ref 32.0–36.0)
Monocyte #: 0.6 x10 3/mm (ref 0.2–1.0)
Neutrophil #: 4.2 10*3/uL (ref 1.4–6.5)
Neutrophil %: 74.8 %
RDW: 17.5 % — ABNORMAL HIGH (ref 11.5–14.5)

## 2012-02-23 LAB — CBC WITH DIFFERENTIAL/PLATELET
Basophil #: 0 10*3/uL (ref 0.0–0.1)
Basophil %: 0.4 %
Eosinophil #: 0.1 10*3/uL (ref 0.0–0.7)
Eosinophil %: 1.2 %
HGB: 7.8 g/dL — ABNORMAL LOW (ref 13.0–18.0)
Lymphocyte #: 0.7 10*3/uL — ABNORMAL LOW (ref 1.0–3.6)
Lymphocyte %: 10 %
MCH: 26.8 pg (ref 26.0–34.0)
MCHC: 31.2 g/dL — ABNORMAL LOW (ref 32.0–36.0)
Monocyte #: 0.5 x10 3/mm (ref 0.2–1.0)
Neutrophil %: 80.3 %
Platelet: 196 10*3/uL (ref 150–440)
RBC: 2.91 10*6/uL — ABNORMAL LOW (ref 4.40–5.90)
RDW: 17.4 % — ABNORMAL HIGH (ref 11.5–14.5)

## 2012-02-23 LAB — BASIC METABOLIC PANEL
Calcium, Total: 8.1 mg/dL — ABNORMAL LOW (ref 8.5–10.1)
Chloride: 100 mmol/L (ref 98–107)
EGFR (African American): 18 — ABNORMAL LOW
EGFR (Non-African Amer.): 15 — ABNORMAL LOW
Glucose: 171 mg/dL — ABNORMAL HIGH (ref 65–99)
Osmolality: 296 (ref 275–301)
Potassium: 2.8 mmol/L — ABNORMAL LOW (ref 3.5–5.1)

## 2012-02-23 LAB — URINALYSIS, COMPLETE
Bilirubin,UR: NEGATIVE
Glucose,UR: NEGATIVE mg/dL (ref 0–75)
Hyaline Cast: 3
Ketone: NEGATIVE
Nitrite: NEGATIVE
Ph: 5 (ref 4.5–8.0)
RBC,UR: 101 /HPF (ref 0–5)
WBC UR: 430 /HPF (ref 0–5)

## 2012-02-23 LAB — PHOSPHORUS: Phosphorus: 2.2 mg/dL — ABNORMAL LOW (ref 2.5–4.9)

## 2012-02-24 LAB — BASIC METABOLIC PANEL
Anion Gap: 10 (ref 7–16)
BUN: 29 mg/dL — ABNORMAL HIGH (ref 7–18)
Chloride: 99 mmol/L (ref 98–107)
Co2: 31 mmol/L (ref 21–32)
Creatinine: 2.57 mg/dL — ABNORMAL HIGH (ref 0.60–1.30)
EGFR (African American): 28 — ABNORMAL LOW
EGFR (Non-African Amer.): 24 — ABNORMAL LOW
Glucose: 159 mg/dL — ABNORMAL HIGH (ref 65–99)
Osmolality: 289 (ref 275–301)
Sodium: 140 mmol/L (ref 136–145)

## 2012-02-24 LAB — CBC WITH DIFFERENTIAL/PLATELET
Basophil %: 0.6 %
Eosinophil #: 0.1 10*3/uL (ref 0.0–0.7)
Eosinophil %: 1.2 %
HGB: 7.9 g/dL — ABNORMAL LOW (ref 13.0–18.0)
Lymphocyte %: 14.8 %
MCHC: 31.2 g/dL — ABNORMAL LOW (ref 32.0–36.0)
Monocyte %: 6.8 %
Neutrophil #: 5.8 10*3/uL (ref 1.4–6.5)
Neutrophil %: 76.6 %
RBC: 2.95 10*6/uL — ABNORMAL LOW (ref 4.40–5.90)

## 2012-02-25 LAB — BASIC METABOLIC PANEL
Anion Gap: 9 (ref 7–16)
BUN: 38 mg/dL — ABNORMAL HIGH (ref 7–18)
Calcium, Total: 8 mg/dL — ABNORMAL LOW (ref 8.5–10.1)
Co2: 31 mmol/L (ref 21–32)
Creatinine: 3.19 mg/dL — ABNORMAL HIGH (ref 0.60–1.30)
EGFR (African American): 21 — ABNORMAL LOW
EGFR (Non-African Amer.): 18 — ABNORMAL LOW
Glucose: 139 mg/dL — ABNORMAL HIGH (ref 65–99)
Osmolality: 289 (ref 275–301)
Potassium: 4.1 mmol/L (ref 3.5–5.1)

## 2012-02-25 LAB — CBC WITH DIFFERENTIAL/PLATELET
Basophil #: 0 10*3/uL (ref 0.0–0.1)
Basophil %: 0.5 %
Eosinophil #: 0.1 10*3/uL (ref 0.0–0.7)
HCT: 23.5 % — ABNORMAL LOW (ref 40.0–52.0)
HGB: 7.4 g/dL — ABNORMAL LOW (ref 13.0–18.0)
Lymphocyte #: 1.3 10*3/uL (ref 1.0–3.6)
MCH: 27 pg (ref 26.0–34.0)
MCHC: 31.3 g/dL — ABNORMAL LOW (ref 32.0–36.0)
Neutrophil #: 7.1 10*3/uL — ABNORMAL HIGH (ref 1.4–6.5)
Platelet: 210 10*3/uL (ref 150–440)
RBC: 2.72 10*6/uL — ABNORMAL LOW (ref 4.40–5.90)
RDW: 17.4 % — ABNORMAL HIGH (ref 11.5–14.5)
WBC: 9.1 10*3/uL (ref 3.8–10.6)

## 2012-02-26 LAB — CBC WITH DIFFERENTIAL/PLATELET
Basophil #: 0 10*3/uL (ref 0.0–0.1)
Basophil %: 0.5 %
Eosinophil %: 1.5 %
HGB: 7.2 g/dL — ABNORMAL LOW (ref 13.0–18.0)
Lymphocyte #: 1.5 10*3/uL (ref 1.0–3.6)
MCH: 26.9 pg (ref 26.0–34.0)
MCHC: 31.5 g/dL — ABNORMAL LOW (ref 32.0–36.0)
MCV: 85 fL (ref 80–100)
Monocyte #: 0.6 x10 3/mm (ref 0.2–1.0)
Neutrophil #: 7.7 10*3/uL — ABNORMAL HIGH (ref 1.4–6.5)
RBC: 2.7 10*6/uL — ABNORMAL LOW (ref 4.40–5.90)
RDW: 17.9 % — ABNORMAL HIGH (ref 11.5–14.5)
WBC: 10 10*3/uL (ref 3.8–10.6)

## 2012-02-27 LAB — BASIC METABOLIC PANEL
Anion Gap: 7 (ref 7–16)
Calcium, Total: 8.1 mg/dL — ABNORMAL LOW (ref 8.5–10.1)
Chloride: 100 mmol/L (ref 98–107)
EGFR (Non-African Amer.): 23 — ABNORMAL LOW
Glucose: 145 mg/dL — ABNORMAL HIGH (ref 65–99)
Osmolality: 288 (ref 275–301)
Potassium: 3.6 mmol/L (ref 3.5–5.1)

## 2012-02-27 LAB — CBC WITH DIFFERENTIAL/PLATELET
Basophil %: 0.3 %
Eosinophil %: 1.6 %
HCT: 23.9 % — ABNORMAL LOW (ref 40.0–52.0)
HGB: 7.3 g/dL — ABNORMAL LOW (ref 13.0–18.0)
Lymphocyte #: 1.1 10*3/uL (ref 1.0–3.6)
MCHC: 30.5 g/dL — ABNORMAL LOW (ref 32.0–36.0)
MCV: 86 fL (ref 80–100)
Monocyte %: 7.4 %
Neutrophil #: 6 10*3/uL (ref 1.4–6.5)
RBC: 2.76 10*6/uL — ABNORMAL LOW (ref 4.40–5.90)
WBC: 7.8 10*3/uL (ref 3.8–10.6)

## 2012-02-27 LAB — CULTURE, BLOOD (SINGLE)

## 2012-02-28 LAB — CBC WITH DIFFERENTIAL/PLATELET
Basophil #: 0 10*3/uL (ref 0.0–0.1)
Basophil %: 0.4 %
Eosinophil %: 1.7 %
Lymphocyte %: 15.8 %
MCH: 26.5 pg (ref 26.0–34.0)
MCV: 85 fL (ref 80–100)
Monocyte #: 0.7 x10 3/mm (ref 0.2–1.0)
Monocyte %: 8 %
Neutrophil #: 6.1 10*3/uL (ref 1.4–6.5)
Platelet: 310 10*3/uL (ref 150–440)
RBC: 2.54 10*6/uL — ABNORMAL LOW (ref 4.40–5.90)

## 2012-02-29 LAB — RENAL FUNCTION PANEL
Albumin: 2 g/dL — ABNORMAL LOW (ref 3.4–5.0)
Anion Gap: 7 (ref 7–16)
BUN: 39 mg/dL — ABNORMAL HIGH (ref 7–18)
Calcium, Total: 8.1 mg/dL — ABNORMAL LOW (ref 8.5–10.1)
EGFR (African American): 28 — ABNORMAL LOW
Glucose: 127 mg/dL — ABNORMAL HIGH (ref 65–99)
Osmolality: 289 (ref 275–301)
Phosphorus: 1.1 mg/dL — ABNORMAL LOW (ref 2.5–4.9)
Potassium: 4 mmol/L (ref 3.5–5.1)

## 2012-02-29 LAB — CBC WITH DIFFERENTIAL/PLATELET
Basophil %: 0.4 %
Eosinophil %: 2.1 %
HCT: 22.7 % — ABNORMAL LOW (ref 40.0–52.0)
HGB: 7.1 g/dL — ABNORMAL LOW (ref 13.0–18.0)
Lymphocyte %: 21.9 %
MCHC: 31.2 g/dL — ABNORMAL LOW (ref 32.0–36.0)
MCV: 85 fL (ref 80–100)
Monocyte #: 0.7 x10 3/mm (ref 0.2–1.0)
Monocyte %: 9.3 %
Neutrophil %: 66.3 %
RBC: 2.66 10*6/uL — ABNORMAL LOW (ref 4.40–5.90)
WBC: 7.6 10*3/uL (ref 3.8–10.6)

## 2012-02-29 LAB — PHOSPHORUS: Phosphorus: 1.2 mg/dL — ABNORMAL LOW (ref 2.5–4.9)

## 2012-02-29 LAB — ALBUMIN: Albumin: 2 g/dL — ABNORMAL LOW (ref 3.4–5.0)

## 2012-03-01 ENCOUNTER — Ambulatory Visit (HOSPITAL_COMMUNITY)
Admission: AD | Admit: 2012-03-01 | Discharge: 2012-03-01 | Disposition: A | Discharge status: Home / Self Care | Payer: Medicare Other | Source: Other Acute Inpatient Hospital | Attending: Internal Medicine | Admitting: Internal Medicine

## 2012-03-01 ENCOUNTER — Inpatient Hospital Stay
Admission: EM | Admit: 2012-03-01 | Discharge: 2012-06-10 | Disposition: A | Discharge status: Skilled Nursing Facility | Payer: Medicare Other | Source: Ambulatory Visit | Attending: Internal Medicine | Admitting: Internal Medicine

## 2012-03-01 DIAGNOSIS — I509 Heart failure, unspecified: Secondary | ICD-10-CM

## 2012-03-01 DIAGNOSIS — Z930 Tracheostomy status: Secondary | ICD-10-CM

## 2012-03-01 DIAGNOSIS — J9600 Acute respiratory failure, unspecified whether with hypoxia or hypercapnia: Secondary | ICD-10-CM | POA: Insufficient documentation

## 2012-03-01 DIAGNOSIS — J189 Pneumonia, unspecified organism: Secondary | ICD-10-CM

## 2012-03-01 DIAGNOSIS — J9601 Acute respiratory failure with hypoxia: Secondary | ICD-10-CM

## 2012-03-01 DIAGNOSIS — A419 Sepsis, unspecified organism: Secondary | ICD-10-CM | POA: Insufficient documentation

## 2012-03-01 DIAGNOSIS — N289 Disorder of kidney and ureter, unspecified: Secondary | ICD-10-CM | POA: Insufficient documentation

## 2012-03-01 HISTORY — DX: Anxiety disorder, unspecified: F41.9

## 2012-03-01 HISTORY — DX: Type 2 diabetes mellitus without complications: E11.9

## 2012-03-01 HISTORY — DX: Gout, unspecified: M10.9

## 2012-03-01 HISTORY — DX: Chronic kidney disease, unspecified: N18.9

## 2012-03-01 HISTORY — DX: Unspecified diastolic (congestive) heart failure: I50.30

## 2012-03-01 HISTORY — DX: Gastro-esophageal reflux disease without esophagitis: K21.9

## 2012-03-01 HISTORY — DX: Anemia, unspecified: D64.9

## 2012-03-01 HISTORY — DX: Essential (primary) hypertension: I10

## 2012-03-01 HISTORY — DX: Monoclonal gammopathy: D47.2

## 2012-03-01 LAB — RENAL FUNCTION PANEL
Albumin: 2.1 g/dL — ABNORMAL LOW (ref 3.4–5.0)
Anion Gap: 8 (ref 7–16)
BUN: 30 mg/dL — ABNORMAL HIGH (ref 7–18)
Chloride: 98 mmol/L (ref 98–107)
Creatinine: 1.98 mg/dL — ABNORMAL HIGH (ref 0.60–1.30)
EGFR (African American): 38 — ABNORMAL LOW
EGFR (Non-African Amer.): 33 — ABNORMAL LOW
Glucose: 142 mg/dL — ABNORMAL HIGH (ref 65–99)
Osmolality: 282 (ref 275–301)
Phosphorus: 1.4 mg/dL — ABNORMAL LOW (ref 2.5–4.9)
Potassium: 4.1 mmol/L (ref 3.5–5.1)

## 2012-03-01 LAB — CBC WITH DIFFERENTIAL/PLATELET
Basophil #: 0.1 10*3/uL (ref 0.0–0.1)
Eosinophil #: 0.1 10*3/uL (ref 0.0–0.7)
HCT: 24.9 % — ABNORMAL LOW (ref 40.0–52.0)
Lymphocyte %: 20.8 %
MCH: 26 pg (ref 26.0–34.0)
Monocyte #: 0.7 x10 3/mm (ref 0.2–1.0)
Monocyte %: 8 %
Neutrophil %: 68.8 %
Platelet: 337 10*3/uL (ref 150–440)
RDW: 17.7 % — ABNORMAL HIGH (ref 11.5–14.5)
WBC: 8.7 10*3/uL (ref 3.8–10.6)

## 2012-03-02 LAB — COMPREHENSIVE METABOLIC PANEL
ALT: 6 U/L (ref 0–53)
AST: 17 U/L (ref 0–37)
Calcium: 8.9 mg/dL (ref 8.4–10.5)
Sodium: 136 mEq/L (ref 135–145)
Total Protein: 7 g/dL (ref 6.0–8.3)

## 2012-03-02 LAB — CBC
MCH: 26 pg (ref 26.0–34.0)
MCHC: 30.1 g/dL (ref 30.0–36.0)
Platelets: 447 10*3/uL — ABNORMAL HIGH (ref 150–400)
RBC: 2.77 MIL/uL — ABNORMAL LOW (ref 4.22–5.81)

## 2012-03-03 ENCOUNTER — Other Ambulatory Visit (HOSPITAL_COMMUNITY): Payer: Medicare Other

## 2012-03-03 LAB — CBC
Hemoglobin: 8 g/dL — ABNORMAL LOW (ref 13.0–17.0)
MCH: 26.1 pg (ref 26.0–34.0)
MCV: 85.6 fL (ref 78.0–100.0)
Platelets: 492 10*3/uL — ABNORMAL HIGH (ref 150–400)
RBC: 3.06 MIL/uL — ABNORMAL LOW (ref 4.22–5.81)
WBC: 7.8 10*3/uL (ref 4.0–10.5)

## 2012-03-03 LAB — BASIC METABOLIC PANEL
CO2: 27 mEq/L (ref 19–32)
Calcium: 9.2 mg/dL (ref 8.4–10.5)
Chloride: 94 mEq/L — ABNORMAL LOW (ref 96–112)
Creatinine, Ser: 2.65 mg/dL — ABNORMAL HIGH (ref 0.50–1.35)
Glucose, Bld: 144 mg/dL — ABNORMAL HIGH (ref 70–99)

## 2012-03-04 LAB — RENAL FUNCTION PANEL
BUN: 51 mg/dL — ABNORMAL HIGH (ref 6–23)
CO2: 27 mEq/L (ref 19–32)
Calcium: 9 mg/dL (ref 8.4–10.5)
Creatinine, Ser: 2.95 mg/dL — ABNORMAL HIGH (ref 0.50–1.35)
Glucose, Bld: 135 mg/dL — ABNORMAL HIGH (ref 70–99)
Phosphorus: 3 mg/dL (ref 2.3–4.6)

## 2012-03-04 LAB — IRON AND TIBC
Iron: 13 ug/dL — ABNORMAL LOW (ref 42–135)
TIBC: 126 ug/dL — ABNORMAL LOW (ref 215–435)

## 2012-03-04 LAB — CBC
Hemoglobin: 7.2 g/dL — ABNORMAL LOW (ref 13.0–17.0)
MCH: 26.3 pg (ref 26.0–34.0)
MCHC: 31 g/dL (ref 30.0–36.0)
MCV: 84.7 fL (ref 78.0–100.0)
RBC: 2.74 MIL/uL — ABNORMAL LOW (ref 4.22–5.81)

## 2012-03-04 LAB — URINE MICROSCOPIC-ADD ON

## 2012-03-04 LAB — URINALYSIS, ROUTINE W REFLEX MICROSCOPIC
Ketones, ur: NEGATIVE mg/dL
Nitrite: POSITIVE — AB
Protein, ur: 100 mg/dL — AB
Urobilinogen, UA: 1 mg/dL (ref 0.0–1.0)
pH: 8 (ref 5.0–8.0)

## 2012-03-04 LAB — TRANSFERRIN: Transferrin: 116 mg/dL — ABNORMAL LOW (ref 200–360)

## 2012-03-04 LAB — PREPARE RBC (CROSSMATCH)

## 2012-03-04 LAB — URINE CULTURE: Colony Count: NO GROWTH

## 2012-03-04 NOTE — Progress Notes (Signed)
  Echocardiogram 2D Echocardiogram has been performed.  Abhinav Mayorquin FRANCES 03/04/2012, 6:01 PM

## 2012-03-04 NOTE — Consult Note (Signed)
Urology Consult   Physician requesting consult: [facility]  Reason for consult: difficulty with catheter placement  History of Present Illness: Derrick [name] is a 71 y.o. male who [facility]. He apparently has urosepsis. The patient also has dialysis dependent renal insufficiency. He had a catheter up until yesterday, which was removed apparently because of pyuria. According to the nursing staff and the hospital physician, repeat catheter replacement is difficult and urologic consultation is requested. He has a bladder volume of 800.  He denies a history of voiding or storage urinary symptoms, hematuria, UTIs, STDs, urolithiasis, GU malignancy/trauma/surgery.  No past medical history on file.  No past surgical history on file.  Medications: Scheduled Meds:   Continuous Infusions:   PRN Meds:.    Allergies: Allergies not on file  No family history on file.  Social History:  does not have a smoking history on file. He does not have any smokeless tobacco history on file. His alcohol and drug histories not on file.  ROS: A complete review of systems was performed.  All systems are negative except for pertinent findings as noted.  Physical Exam:  Vital signs in last 24 hours:   General:  Alert and oriented, No acute distress The patient has a tracheostomy tube in place. He is conversive.  Abdomen is protuberant. There is a gastrostomy tube in place. Phallus reveals a partially circumcised foreskin with maceration. There is mild phimosis. There is blood at the meatus. Scrotal skin is normal.  Laboratory Data:   Basename 03/04/12 0510 03/03/12 0541 03/02/12 0553  WBC 7.1 7.8 7.5  HGB 7.2* 8.0* 7.2*  HCT 23.2* 26.2* 23.9*  PLT 514* 492* 447*     Basename 03/04/12 0510 03/03/12 0541 03/02/12 0553  NA 134* 133* 136  K 4.1 4.9 4.1  CL 95* 94* 96  GLUCOSE 135* 144* 131*  BUN 51* 42* 38*  CALCIUM 9.0 9.2 8.9  CREATININE  2.95* 2.65* 2.48*     Results for orders placed during the hospital encounter of 03/01/12 (from the past 24 hour(s))  CULTURE, RESPIRATORY     Status: Normal (Preliminary result)   Collection Time   03/04/12  3:52 AM      Component Value Range   Specimen Description TRACHEAL ASPIRATE     Special Requests NONE     Gram Stain       Value: NO WBC SEEN     FEW SQUAMOUS EPITHELIAL CELLS PRESENT     NO ORGANISMS SEEN   Culture PENDING     Report Status PENDING    CBC     Status: Abnormal   Collection Time   03/04/12  5:10 AM      Component Value Range   WBC 7.1  4.0 - 10.5 K/uL   RBC 2.74 (*) 4.22 - 5.81 MIL/uL   Hemoglobin 7.2 (*) 13.0 - 17.0 g/dL   HCT 19.1 (*) 47.8 - 29.5 %   MCV 84.7  78.0 - 100.0 fL   MCH 26.3  26.0 - 34.0 pg   MCHC 31.0  30.0 - 36.0 g/dL   RDW 62.1 (*) 30.8 - 65.7 %   Platelets 514 (*) 150 - 400 K/uL  RENAL FUNCTION PANEL     Status: Abnormal   Collection Time   03/04/12  5:10 AM      Component Value Range   Sodium 134 (*) 135 - 145 mEq/L   Potassium 4.1  3.5 - 5.1 mEq/L  Chloride 95 (*) 96 - 112 mEq/L   CO2 27  19 - 32 mEq/L   Glucose, Bld 135 (*) 70 - 99 mg/dL   BUN 51 (*) 6 - 23 mg/dL   Creatinine, Ser 4.09 (*) 0.50 - 1.35 mg/dL   Calcium 9.0  8.4 - 81.1 mg/dL   Phosphorus 3.0  2.3 - 4.6 mg/dL   Albumin 2.3 (*) 3.5 - 5.2 g/dL   GFR calc non Af Amer 20 (*) >90 mL/min   GFR calc Af Amer 23 (*) >90 mL/min  IRON AND TIBC     Status: Abnormal   Collection Time   03/04/12  5:10 AM      Component Value Range   Iron 13 (*) 42 - 135 ug/dL   TIBC 914 (*) 782 - 956 ug/dL   Saturation Ratios 10 (*) 20 - 55 %   UIBC 113 (*) 125 - 400 ug/dL  TRANSFERRIN     Status: Abnormal   Collection Time   03/04/12  5:10 AM      Component Value Range   Transferrin 116 (*) 200 - 360 mg/dL  FERRITIN     Status: Abnormal   Collection Time   03/04/12  5:10 AM      Component Value Range   Ferritin 666 (*) 22 - 322 ng/mL  PREPARE RBC (CROSSMATCH)     Status: Normal    Collection Time   03/04/12  1:25 PM      Component Value Range   Order Confirmation ORDER PROCESSED BY BLOOD BANK    TYPE AND SCREEN     Status: Normal (Preliminary result)   Collection Time   03/04/12  1:25 PM      Component Value Range   ABO/RH(D) O POS     Antibody Screen NEG     Sample Expiration 03/07/2012     Unit Number [id number]     Blood Component Type RED CELLS,LR     Unit division 00     Status of Unit ALLOCATED     Transfusion Status OK TO TRANSFUSE     Crossmatch Result Compatible     Unit Number [id number]     Blood Component Type RED CELLS,LR     Unit division 00     Status of Unit ALLOCATED     Transfusion Status OK TO TRANSFUSE     Crossmatch Result Compatible    ABO/RH     Status: Normal   Collection Time   03/04/12  1:25 PM      Component Value Range   ABO/RH(D) O POS     Recent Results (from the past 240 hour(s))  URINE CULTURE     Status: Normal   Collection Time   03/02/12 11:48 AM      Component Value Range Status Comment   Specimen Description URINE, CLEAN CATCH   Final    Special Requests NONE   Final    Culture  Setup Time 03/02/2012 19:13   Final    Colony Count NO GROWTH   Final    Culture NO GROWTH   Final    Report Status 03/04/2012 FINAL   Final   CULTURE, BLOOD (ROUTINE X 2)     Status: Normal (Preliminary result)   Collection Time   03/02/12  1:00 PM      Component Value Range Status Comment   Specimen Description BLOOD RIGHT HAND   Final    Special Requests BOTTLES DRAWN AEROBIC AND ANAEROBIC 10CC  Final    Culture  Setup Time 03/02/2012 19:14   Final    Culture     Final    Value:        BLOOD CULTURE RECEIVED NO GROWTH TO DATE CULTURE WILL BE HELD FOR 5 DAYS BEFORE ISSUING A FINAL NEGATIVE REPORT   Report Status PENDING   Incomplete   CULTURE, BLOOD (ROUTINE X 2)     Status: Normal (Preliminary result)   Collection Time   03/02/12  7:50 PM      Component Value Range Status Comment   Specimen Description BLOOD RIGHT HAND   Final     Special Requests BOTTLES DRAWN AEROBIC ONLY 3CC   Final    Culture  Setup Time 03/03/2012 01:19   Final    Culture     Final    Value:        BLOOD CULTURE RECEIVED NO GROWTH TO DATE CULTURE WILL BE HELD FOR 5 DAYS BEFORE ISSUING A FINAL NEGATIVE REPORT   Report Status PENDING   Incomplete   CULTURE, RESPIRATORY     Status: Normal (Preliminary result)   Collection Time   03/04/12  3:52 AM      Component Value Range Status Comment   Specimen Description TRACHEAL ASPIRATE   Final    Special Requests NONE   Final    Gram Stain     Final    Value: NO WBC SEEN     FEW SQUAMOUS EPITHELIAL CELLS PRESENT     NO ORGANISMS SEEN   Culture PENDING   Incomplete    Report Status PENDING   Incomplete     Renal Function:  Basename 03/04/12 0510 03/03/12 0541 03/02/12 0553  CREATININE 2.95* 2.65* 2.48*   CrCl is unknown because there is no height on file for the current visit.  Radiologic Imaging: Dg Chest Port 1 View  03/03/2012  *RADIOLOGY REPORT*  Clinical Data: Tracheostomy  PORTABLE CHEST - 1 VIEW  Comparison: None.  Findings: The lung volumes are low.  This exaggerates the heart size.  A tracheostomy is in satisfactory position.  Mild retrocardiac opacification is evident.  There is moderate pulmonary vascular congestion without significant airspace consolidation elsewhere. The paranasal sinuses and mastoid air cells are otherwise clear.  IMPRESSION:  1.  Left basilar airspace disease likely reflects atelectasis. Early infection or aspiration is not excluded. 2.  Moderate pulmonary vascular congestion without significant airspace consolidation otherwise.  Original Report Authenticated By: Jamesetta Orleans. MATTERN, M.D.    I independently reviewed the above imaging studies.  After sterile prep and drape, and discussing the situation with the patient, I placed an 43 French coud-tip catheter. Purulent urine was obtained. Impression/Assessment:  Urinary retention/urosepsis with difficult catheter  placement  Plan:  1. I would leave the catheter in for the present time. If there appears to be pyuria, I would recommend that the hospital physician order the catheter to be irrigated with 70 cc of normal saline with a catheter tip syringe once or twice a day.  2. If the catheter is to be removed, I would suggest that if it needs to be replaced, an experienced nurse place an 38 French coud-tip catheter. This catheter actually passed fairly easily without evidence of any obstruction.  Chelsea Aus 03/04/2012, 4:34 PM    Bertram Millard. Sharna Gabrys MD

## 2012-03-05 LAB — RENAL FUNCTION PANEL
BUN: 40 mg/dL — ABNORMAL HIGH (ref 6–23)
Chloride: 95 mEq/L — ABNORMAL LOW (ref 96–112)
Creatinine, Ser: 2.29 mg/dL — ABNORMAL HIGH (ref 0.50–1.35)
Glucose, Bld: 109 mg/dL — ABNORMAL HIGH (ref 70–99)
Potassium: 3.6 mEq/L (ref 3.5–5.1)

## 2012-03-05 LAB — CBC
HCT: 27 % — ABNORMAL LOW (ref 39.0–52.0)
MCH: 27 pg (ref 26.0–34.0)
MCV: 84.6 fL (ref 78.0–100.0)
RBC: 3.19 MIL/uL — ABNORMAL LOW (ref 4.22–5.81)
WBC: 7.5 10*3/uL (ref 4.0–10.5)

## 2012-03-05 LAB — URINE CULTURE

## 2012-03-06 LAB — TYPE AND SCREEN
ABO/RH(D): O POS
Unit division: 0

## 2012-03-06 LAB — CULTURE, RESPIRATORY W GRAM STAIN

## 2012-03-07 LAB — DIFFERENTIAL
Eosinophils Absolute: 0.1 10*3/uL (ref 0.0–0.7)
Lymphocytes Relative: 20 % (ref 12–46)
Lymphs Abs: 1.6 10*3/uL (ref 0.7–4.0)
Neutro Abs: 5.8 10*3/uL (ref 1.7–7.7)
Neutrophils Relative %: 72 % (ref 43–77)

## 2012-03-07 LAB — CBC
MCH: 27.2 pg (ref 26.0–34.0)
Platelets: 479 10*3/uL — ABNORMAL HIGH (ref 150–400)
RBC: 3.13 MIL/uL — ABNORMAL LOW (ref 4.22–5.81)
WBC: 8.1 10*3/uL (ref 4.0–10.5)

## 2012-03-07 LAB — RENAL FUNCTION PANEL
Albumin: 2.2 g/dL — ABNORMAL LOW (ref 3.5–5.2)
Chloride: 98 mEq/L (ref 96–112)
GFR calc Af Amer: 30 mL/min — ABNORMAL LOW (ref >90)
GFR calc non Af Amer: 26 mL/min — ABNORMAL LOW (ref >90)
Potassium: 2.9 mEq/L — ABNORMAL LOW (ref 3.5–5.1)
Sodium: 139 mEq/L (ref 135–145)

## 2012-03-08 ENCOUNTER — Institutional Professional Consult (permissible substitution) (HOSPITAL_COMMUNITY): Payer: Medicare Other

## 2012-03-08 LAB — CULTURE, BLOOD (ROUTINE X 2): Culture: NO GROWTH

## 2012-03-08 LAB — POTASSIUM: Potassium: 3.9 mEq/L (ref 3.5–5.1)

## 2012-03-09 LAB — POTASSIUM: Potassium: 3.9 mEq/L (ref 3.5–5.1)

## 2012-03-10 ENCOUNTER — Institutional Professional Consult (permissible substitution) (HOSPITAL_COMMUNITY): Payer: Medicare Other

## 2012-03-10 LAB — CULTURE, BLOOD (ROUTINE X 2): Culture: NO GROWTH

## 2012-03-10 LAB — CBC
MCV: 87.2 fL (ref 78.0–100.0)
Platelets: 422 10*3/uL — ABNORMAL HIGH (ref 150–400)
RBC: 2.9 MIL/uL — ABNORMAL LOW (ref 4.22–5.81)
RDW: 18 % — ABNORMAL HIGH (ref 11.5–15.5)
WBC: 9.7 10*3/uL (ref 4.0–10.5)

## 2012-03-10 LAB — RENAL FUNCTION PANEL
Albumin: 2.3 g/dL — ABNORMAL LOW (ref 3.5–5.2)
Chloride: 97 mEq/L (ref 96–112)
GFR calc Af Amer: 20 mL/min — ABNORMAL LOW (ref >90)
GFR calc non Af Amer: 17 mL/min — ABNORMAL LOW (ref >90)
Phosphorus: 4.8 mg/dL — ABNORMAL HIGH (ref 2.3–4.6)
Potassium: 4 mEq/L (ref 3.5–5.1)
Sodium: 138 mEq/L (ref 135–145)

## 2012-03-12 ENCOUNTER — Encounter: Payer: Self-pay | Admitting: Adult Health

## 2012-03-12 DIAGNOSIS — Z930 Tracheostomy status: Secondary | ICD-10-CM

## 2012-03-12 DIAGNOSIS — I509 Heart failure, unspecified: Secondary | ICD-10-CM

## 2012-03-12 DIAGNOSIS — J9600 Acute respiratory failure, unspecified whether with hypoxia or hypercapnia: Secondary | ICD-10-CM

## 2012-03-12 DIAGNOSIS — J9601 Acute respiratory failure with hypoxia: Secondary | ICD-10-CM

## 2012-03-12 DIAGNOSIS — J189 Pneumonia, unspecified organism: Secondary | ICD-10-CM

## 2012-03-12 LAB — CBC
Hemoglobin: 8.3 g/dL — ABNORMAL LOW (ref 13.0–17.0)
MCHC: 31.9 g/dL (ref 30.0–36.0)
RBC: 3.05 MIL/uL — ABNORMAL LOW (ref 4.22–5.81)
WBC: 8.2 10*3/uL (ref 4.0–10.5)

## 2012-03-12 LAB — RENAL FUNCTION PANEL
Chloride: 94 mEq/L — ABNORMAL LOW (ref 96–112)
GFR calc Af Amer: 20 mL/min — ABNORMAL LOW (ref >90)
Glucose, Bld: 160 mg/dL — ABNORMAL HIGH (ref 70–99)
Phosphorus: 4.5 mg/dL (ref 2.3–4.6)
Potassium: 3.2 mEq/L — ABNORMAL LOW (ref 3.5–5.1)
Sodium: 136 mEq/L (ref 135–145)

## 2012-03-12 NOTE — Consult Note (Signed)
Patient: Derrick [name] DOB: May 10, 1941 Date of Admission: 03/01/2012            Pulmonary consult  Date of Consult: 03/12/2012 MD requesting consult: [facility] Reason for consult:  ??decannulation   HPI -  71yo male with hx chronic renal failure, HTN, DM, CHF who was admitted from SNF to [facility] hospital on 6/24 with AMS.  He was found to have urinary retention and MRSA/Ecoli UTI with septic shock.  He was intubated, required pressors and dialysis.  He was initially unable to wean from vent and tracheostomy was placed.   Course was c/b acute on chronic renal failure, CDiff and acute on chronic diastolic dysfunction.  He has since been weaned from the vent and has tolerated ATC at all times since [facility] for further abx and rehab.  He is improving overall and nearing discharge planning.  However his dialysis has become chronic and placement options are limited, so PCCM has been asked to eval for potential decannulation.    Allergies:  Allergies  Allergen Reactions  . Nsaids      PMH: Past Medical History  Diagnosis Date  . Gout   . MGUS (monoclonal gammopathy of unknown significance)   . Chronic renal failure   . Diabetes mellitus   . HTN (hypertension)   . Diastolic congestive heart failure   . Anxiety   . Anemia   . GERD (gastroesophageal reflux disease)      Social Hx: History   Social History  . Marital Status: Married    Spouse Name: N/A    Number of Children: N/A  . Years of Education: N/A   Occupational History  . Not on file.   Social History Main Topics  . Smoking status: Not on file  . Smokeless tobacco: Not on file  . Alcohol Use: Not on file  . Drug Use: Not on file  . Sexually Active: Not on file   Other Topics Concern  . Not on file   Social History Narrative  . No narrative on file     Family Hx: No family history on file.   ROS: Unable, pt minimally interactive.    chest X-ray 7/27>> pulm edema   CT head  7/29>> Ct Head Wo Contrast  03/10/2012  *RADIOLOGY REPORT*  Clinical Data: Change in mental status.  Headache.  CT HEAD WITHOUT CONTRAST  Technique:  Contiguous axial images were obtained from the base of the skull through the vertex without contrast.  Comparison: None  Findings: There is significant central cortical atrophy. Periventricular white matter changes are consistent with small vessel disease. There is no evidence for hemorrhage, mass lesion, or acute infarction.There is atherosclerotic calcification of the internal carotid arteries.  Otherwise, bone windows show no acute findings.  IMPRESSION: 1.  Atrophy and small vessel disease. 2. No evidence for acute intracranial abnormality.  Original Report Authenticated By: Patterson Hammersmith, M.D.     CBC    Component Value Date/Time   WBC 8.2 03/12/2012 0530   RBC 3.05* 03/12/2012 0530   HGB 8.3* 03/12/2012 0530   HCT 26.0* 03/12/2012 0530   PLT 430* 03/12/2012 0530   MCV 85.2 03/12/2012 0530   MCH 27.2 03/12/2012 0530   MCHC 31.9 03/12/2012 0530   RDW 18.1* 03/12/2012 0530   LYMPHSABS 1.6 03/07/2012 0730   MONOABS 0.6 03/07/2012 0730   EOSABS 0.1 03/07/2012 0730   BASOSABS 0.0 03/07/2012 0730     BMET    Component  Value Date/Time   NA 136 03/12/2012 0530   K 3.2* 03/12/2012 0530   CL 94* 03/12/2012 0530   CO2 28 03/12/2012 0530   GLUCOSE 160* 03/12/2012 0530   BUN 65* 03/12/2012 0530   CREATININE 3.28* 03/12/2012 0530   CALCIUM 8.6 03/12/2012 0530   GFRNONAA 17* 03/12/2012 0530   GFRAA 20* 03/12/2012 0530     EXAM: General:  Chronically ill appearing male, NAD in bed  Neuro:  Drowsy, arousable, opens eyes but minimally interactive.  Grunts, occasionally nods head yes/no.  [name], gen weakness CV: s1s2 rrr PULM: resps even non labored on ATC with PMV.  #6cuffless trach in place  GI: abd soft, +bs Extremities:  Warm and dry, no edema   IMPRESSION/ PLAN:  Chronic trach dependent respiratory failure -- trach initially placed r/t inability  to wean from vent in setting UTI with septic shock, acute on chronic renal failure and now likely ESRD.   Pt remains severely deconditioned and mental status remains an issue at times.  PLAN -  Cont ATC as tolerated with PMV and po diet as tol  Would not consider decannulation until mental status a little clearer and overall deconditioning improved.   In the meantime consider change to 6 cuffless, order written Keep dry as tol with HD Cont pt/ot   Neurostatus biggest hindrence to decan at this stage It is good that he uses PMV for long durations   WHITEHEART,KATHRYN, NP 03/12/2012  1:11 PM Pager: [phone]  *Care during the described time interval was provided by me and/or other providers on the critical care team. I have reviewed this patient's available data, including medical history, events of note, physical examination and test results as part of my evaluation.   I have fully examined this patient and agree with above findings.    And edited in full  Mcarthur Rossetti. Tyson Alias, MD, FACP Pgr: [phone] [facility]

## 2012-03-13 ENCOUNTER — Ambulatory Visit: Payer: Self-pay | Admitting: Oncology

## 2012-03-14 LAB — CBC
HCT: 27.4 % — ABNORMAL LOW (ref 39.0–52.0)
Platelets: 426 10*3/uL — ABNORMAL HIGH (ref 150–400)
RBC: 3.18 MIL/uL — ABNORMAL LOW (ref 4.22–5.81)
RDW: 18.2 % — ABNORMAL HIGH (ref 11.5–15.5)
WBC: 9.8 10*3/uL (ref 4.0–10.5)

## 2012-03-14 LAB — RENAL FUNCTION PANEL
Albumin: 2.4 g/dL — ABNORMAL LOW (ref 3.5–5.2)
BUN: 45 mg/dL — ABNORMAL HIGH (ref 6–23)
Chloride: 94 mEq/L — ABNORMAL LOW (ref 96–112)
GFR calc non Af Amer: 20 mL/min — ABNORMAL LOW (ref >90)
Potassium: 3.1 mEq/L — ABNORMAL LOW (ref 3.5–5.1)

## 2012-03-15 LAB — POTASSIUM: Potassium: 3.7 mEq/L (ref 3.5–5.1)

## 2012-03-16 LAB — URINE MICROSCOPIC-ADD ON

## 2012-03-16 LAB — URINALYSIS, ROUTINE W REFLEX MICROSCOPIC
Bilirubin Urine: NEGATIVE
Nitrite: NEGATIVE
Specific Gravity, Urine: 1.019 (ref 1.005–1.030)
pH: 7 (ref 5.0–8.0)

## 2012-03-17 ENCOUNTER — Other Ambulatory Visit (HOSPITAL_COMMUNITY): Payer: Medicare Other

## 2012-03-17 LAB — BASIC METABOLIC PANEL
BUN: 51 mg/dL — ABNORMAL HIGH (ref 6–23)
Calcium: 9.2 mg/dL (ref 8.4–10.5)
Creatinine, Ser: 3.68 mg/dL — ABNORMAL HIGH (ref 0.50–1.35)
GFR calc Af Amer: 18 mL/min — ABNORMAL LOW (ref >90)
GFR calc non Af Amer: 15 mL/min — ABNORMAL LOW (ref >90)
Potassium: 3.8 mEq/L (ref 3.5–5.1)

## 2012-03-17 LAB — CBC
Hemoglobin: 8 g/dL — ABNORMAL LOW (ref 13.0–17.0)
MCHC: 31.1 g/dL (ref 30.0–36.0)
RBC: 2.98 MIL/uL — ABNORMAL LOW (ref 4.22–5.81)

## 2012-03-17 LAB — URINE CULTURE

## 2012-03-17 LAB — RENAL FUNCTION PANEL
GFR calc Af Amer: 17 mL/min — ABNORMAL LOW (ref >90)
Glucose, Bld: 191 mg/dL — ABNORMAL HIGH (ref 70–99)
Phosphorus: 4.7 mg/dL — ABNORMAL HIGH (ref 2.3–4.6)
Potassium: 3.7 mEq/L (ref 3.5–5.1)
Sodium: 139 mEq/L (ref 135–145)

## 2012-03-17 NOTE — Progress Notes (Signed)
Patient: Derrick [name] DOB: 04-12-1941 Date of Admission: 03/01/2012            Pulmonary consult  Date of Consult: 03/17/2012 MD requesting consult: [facility] Reason for consult:  ??decannulation   HPI -  71yo male with hx chronic renal failure, HTN, DM, CHF who was admitted from SNF to [facility] hospital on 6/24 with AMS.  He was found to have urinary retention and MRSA/Ecoli UTI with septic shock.  He was intubated, required pressors and dialysis.  He was initially unable to wean from vent and tracheostomy was placed.   Course was c/b acute on chronic renal failure, CDiff and acute on chronic diastolic dysfunction.  He has since been weaned from the vent and has tolerated ATC at all times since [facility] for further abx and rehab.  He is improving overall and nearing discharge planning.  However his dialysis has become chronic and placement options are limited, so PCCM has been asked to eval for potential decannulation.    chest X-ray 7/27>> pulm edema   CT head 7/29>> Dg Chest Port 1 View  03/17/2012  *RADIOLOGY REPORT*  Clinical Data: Tracheostomy, respiratory distress  PORTABLE CHEST - 1 VIEW  Comparison: 03/08/2012  Findings: Tracheostomy in the mid trachea.  Cardiomegaly noted with diffuse mild interstitial edema pattern and dense left lower lobe collapse / consolidation.  Left effusion not excluded.  No pneumothorax.  No significant interval change.  IMPRESSION: Cardiomegaly with mild edema pattern and persistent left lower lobe collapse / consolidation.  Original Report Authenticated By: Judie Petit. Ruel Favors, M.D.     CBC    Component Value Date/Time   WBC 10.8* 03/17/2012 0527   RBC 2.98* 03/17/2012 0527   HGB 8.0* 03/17/2012 0527   HCT 25.7* 03/17/2012 0527   PLT 376 03/17/2012 0527   MCV 86.2 03/17/2012 0527   MCH 26.8 03/17/2012 0527   MCHC 31.1 03/17/2012 0527   RDW 19.4* 03/17/2012 0527   LYMPHSABS 1.6 03/07/2012 0730   MONOABS 0.6 03/07/2012 0730   EOSABS 0.1 03/07/2012 0730     BASOSABS 0.0 03/07/2012 0730     BMET    Component Value Date/Time   NA 138 03/17/2012 0527   K 3.8 03/17/2012 0527   CL 96 03/17/2012 0527   CO2 27 03/17/2012 0527   GLUCOSE 110* 03/17/2012 0527   BUN 51* 03/17/2012 0527   CREATININE 3.68* 03/17/2012 0527   CALCIUM 9.2 03/17/2012 0527   GFRNONAA 15* 03/17/2012 0527   GFRAA 18* 03/17/2012 0527     EXAM: General:  Chronically ill appearing male, NAD in bed  Neuro:  Drowsy, arousable, opens eyes but minimally interactive.  Grunts, occasionally nods head yes/no.  [name], gen weakness CV: s1s2 rrr PULM: resps even non labored on ATC with PMV.  #6cuffless trach in place  GI: abd soft, +bs Extremities:  Warm and dry, no edema   IMPRESSION/ PLAN:  Chronic trach dependent respiratory failure -- trach initially placed r/t inability to wean from vent in setting UTI with septic shock, acute on chronic renal failure and now likely ESRD.   Pt remains severely deconditioned and mental status remains an issue at times.  PLAN -  Cont ATC as tolerated with PMV and po diet as tol  Would not consider decannulation until mental status a little clearer and overall deconditioning improved, anticipate will happen soon.   In the meantime consider change to 6 cuffless, order written Keep dry as tol with HD Cont  pt/ot   Neurostatus biggest hindrence to decan at this stage It is good that he uses PMV for long durations  Alyson Reedy, M.D. [facility] Pulmonary/Critical Care Medicine. Pager: [phone]. After hours pager: [phone].

## 2012-03-18 ENCOUNTER — Other Ambulatory Visit (HOSPITAL_COMMUNITY): Payer: Medicare Other

## 2012-03-18 LAB — URINE CULTURE
Colony Count: NO GROWTH
Culture: NO GROWTH

## 2012-03-18 LAB — PTH, INTACT AND CALCIUM: PTH: 123.2 pg/mL — ABNORMAL HIGH (ref 14.0–72.0)

## 2012-03-19 LAB — CLOSTRIDIUM DIFFICILE BY PCR: Toxigenic C. Difficile by PCR: NEGATIVE

## 2012-03-19 LAB — RENAL FUNCTION PANEL
Albumin: 2.2 g/dL — ABNORMAL LOW (ref 3.5–5.2)
BUN: 49 mg/dL — ABNORMAL HIGH (ref 6–23)
Calcium: 9 mg/dL (ref 8.4–10.5)
GFR calc Af Amer: 17 mL/min — ABNORMAL LOW (ref >90)
Glucose, Bld: 199 mg/dL — ABNORMAL HIGH (ref 70–99)
Phosphorus: 4.1 mg/dL (ref 2.3–4.6)
Potassium: 3.3 mEq/L — ABNORMAL LOW (ref 3.5–5.1)
Sodium: 135 mEq/L (ref 135–145)

## 2012-03-19 LAB — CBC WITH DIFFERENTIAL/PLATELET
Basophils Relative: 0 % (ref 0–1)
Eosinophils Absolute: 0.2 10*3/uL (ref 0.0–0.7)
Eosinophils Relative: 2 % (ref 0–5)
Lymphs Abs: 1.8 10*3/uL (ref 0.7–4.0)
MCH: 27.8 pg (ref 26.0–34.0)
MCHC: 32.1 g/dL (ref 30.0–36.0)
MCV: 86.5 fL (ref 78.0–100.0)
Neutrophils Relative %: 71 % (ref 43–77)
Platelets: 311 10*3/uL (ref 150–400)
RBC: 2.81 MIL/uL — ABNORMAL LOW (ref 4.22–5.81)
RDW: 19.5 % — ABNORMAL HIGH (ref 11.5–15.5)

## 2012-03-21 ENCOUNTER — Institutional Professional Consult (permissible substitution) (HOSPITAL_COMMUNITY): Payer: Medicare Other

## 2012-03-21 LAB — CBC
HCT: 26.9 % — ABNORMAL LOW (ref 39.0–52.0)
Hemoglobin: 8.3 g/dL — ABNORMAL LOW (ref 13.0–17.0)
RBC: 3.05 MIL/uL — ABNORMAL LOW (ref 4.22–5.81)

## 2012-03-21 LAB — RENAL FUNCTION PANEL
CO2: 27 mEq/L (ref 19–32)
Chloride: 95 mEq/L — ABNORMAL LOW (ref 96–112)
GFR calc Af Amer: 16 mL/min — ABNORMAL LOW (ref >90)
GFR calc non Af Amer: 14 mL/min — ABNORMAL LOW (ref >90)
Glucose, Bld: 177 mg/dL — ABNORMAL HIGH (ref 70–99)
Sodium: 138 mEq/L (ref 135–145)

## 2012-03-21 NOTE — Progress Notes (Signed)
Patient: Derrick [name] DOB: [birth year] Date of Admission: 03/01/2012            Pulmonary consult  Date of Consult: 03/21/2012 MD requesting consult: [facility] Reason for consult:  ??decannulation   HPI -  71yo male with hx chronic renal failure, HTN, DM, CHF who was admitted from SNF to [facility] hospital on 6/24 with AMS.  He was found to have urinary retention and MRSA/Ecoli UTI with septic shock.  He was intubated, required pressors and dialysis.  He was initially unable to wean from vent and tracheostomy was placed.   Course was c/b acute on chronic renal failure, CDiff and acute on chronic diastolic dysfunction.  He has since been weaned from the vent and has tolerated ATC at all times since [facility] to [facility] for further abx and rehab.  He is improving overall and nearing discharge planning.  However his dialysis has become chronic and placement options are limited, so PCCM has been asked to eval for potential decannulation.    chest X-ray 7/27>> pulm edema   CT head 7/29>> Dg Chest Port 1 View  03/21/2012  *RADIOLOGY REPORT*  Clinical Data: The toric failure with fever  PORTABLE CHEST - 1 VIEW  Comparison: 03/18/2012  Findings: The tracheostomy tube is stable in position.  Marked cardiomegaly is identified and is stable in degree.  Left pleural effusion and increased density at the left lung base is stable in comparison with the prior exam. The left basilar density is likely the result of pulmonary fluid and atelectasis with an area of focal infiltrate not excluded.  Some persistent pulmonary vascular congestion with mild interstitial edema is noted.  No overt congestive failure is seen.  IMPRESSION: Slightly lower lung volumes with otherwise unchanged cardiomegaly, pulmonary vascular congestion, mild edema, left effusion and left lower lobe atelectasis/ infiltrate.  Original Report Authenticated By: Bertha Stakes, M.D.     CBC    Component Value Date/Time   WBC 10.9*  03/21/2012 0500   RBC 3.05* 03/21/2012 0500   HGB 8.3* 03/21/2012 0500   HCT 26.9* 03/21/2012 0500   PLT 338 03/21/2012 0500   MCV 88.2 03/21/2012 0500   MCH 27.2 03/21/2012 0500   MCHC 30.9 03/21/2012 0500   RDW 20.0* 03/21/2012 0500   LYMPHSABS 1.8 03/19/2012 0535   MONOABS 0.8 03/19/2012 0535   EOSABS 0.2 03/19/2012 0535   BASOSABS 0.0 03/19/2012 0535     BMET    Component Value Date/Time   NA 138 03/21/2012 0500   K 4.0 03/21/2012 0500   CL 95* 03/21/2012 0500   CO2 27 03/21/2012 0500   GLUCOSE 177* 03/21/2012 0500   BUN 54* 03/21/2012 0500   CREATININE 3.99* 03/21/2012 0500   CALCIUM 9.5 03/21/2012 0500   CALCIUM 8.7 03/19/2012 0535   GFRNONAA 14* 03/21/2012 0500   GFRAA 16* 03/21/2012 0500     EXAM: General:  Chronically ill appearing male, NAD in bed  Neuro:  Drowsy, arousable, opens eyes but minimally interactive.  Grunts, occasionally nods head yes/no.  [name], gen weakness CV: s1s2 rrr PULM: resps even non labored on ATC with PMV.  #6cuffless trach in place  GI: abd soft, +bs Extremities:  Warm and dry, no edema   IMPRESSION/ PLAN:  Chronic trach dependent respiratory failure -- trach initially placed r/t inability to wean from vent in setting UTI with septic shock, acute on chronic renal failure and now likely ESRD.   Pt remains severely deconditioned and mental status remains  an issue at times.  PLAN -  Cont ATC as tolerated with PMV and po diet as tol  Would not consider decannulation until mental status a little clearer and overall deconditioning improved.   Keep dry as tol with HD Cont pt/ot  Doubt decannulation given neuro status.  Would not decannulate without significant improvement in mental status and physical conditioning.  PCCM will sign off, please call back if needed.  Alyson Reedy, M.D. [facility] Pulmonary/Critical Care Medicine. Pager: [phone]. After hours pager: [phone].

## 2012-03-22 LAB — CBC WITH DIFFERENTIAL/PLATELET
Basophils Absolute: 0 10*3/uL (ref 0.0–0.1)
Eosinophils Absolute: 0.1 10*3/uL (ref 0.0–0.7)
Eosinophils Relative: 1 % (ref 0–5)
HCT: 26.7 % — ABNORMAL LOW (ref 39.0–52.0)
Lymphocytes Relative: 14 % (ref 12–46)
MCH: 27 pg (ref 26.0–34.0)
MCHC: 30.7 g/dL (ref 30.0–36.0)
MCV: 87.8 fL (ref 78.0–100.0)
Monocytes Absolute: 0.6 10*3/uL (ref 0.1–1.0)
RDW: 20 % — ABNORMAL HIGH (ref 11.5–15.5)
WBC: 9 10*3/uL (ref 4.0–10.5)

## 2012-03-22 LAB — BASIC METABOLIC PANEL
BUN: 41 mg/dL — ABNORMAL HIGH (ref 6–23)
CO2: 31 mEq/L (ref 19–32)
Calcium: 9.5 mg/dL (ref 8.4–10.5)
Creatinine, Ser: 3.12 mg/dL — ABNORMAL HIGH (ref 0.50–1.35)
Glucose, Bld: 165 mg/dL — ABNORMAL HIGH (ref 70–99)

## 2012-03-22 LAB — CULTURE, BLOOD (ROUTINE X 2): Culture: NO GROWTH

## 2012-03-24 LAB — CULTURE, RESPIRATORY W GRAM STAIN: Gram Stain: NONE SEEN

## 2012-03-24 LAB — CULTURE, BLOOD (ROUTINE X 2)
Culture: NO GROWTH
Culture: NO GROWTH

## 2012-03-24 LAB — CBC
Hemoglobin: 7.9 g/dL — ABNORMAL LOW (ref 13.0–17.0)
MCH: 27.1 pg (ref 26.0–34.0)
MCV: 86.3 fL (ref 78.0–100.0)
RBC: 2.92 MIL/uL — ABNORMAL LOW (ref 4.22–5.81)

## 2012-03-24 LAB — RENAL FUNCTION PANEL
Albumin: 2.3 g/dL — ABNORMAL LOW (ref 3.5–5.2)
BUN: 77 mg/dL — ABNORMAL HIGH (ref 6–23)
Chloride: 95 mEq/L — ABNORMAL LOW (ref 96–112)
Creatinine, Ser: 5.01 mg/dL — ABNORMAL HIGH (ref 0.50–1.35)

## 2012-03-25 LAB — BASIC METABOLIC PANEL
BUN: 49 mg/dL — ABNORMAL HIGH (ref 6–23)
Chloride: 95 mEq/L — ABNORMAL LOW (ref 96–112)
Glucose, Bld: 138 mg/dL — ABNORMAL HIGH (ref 70–99)
Potassium: 4.9 mEq/L (ref 3.5–5.1)

## 2012-03-26 LAB — RENAL FUNCTION PANEL
BUN: 67 mg/dL — ABNORMAL HIGH (ref 6–23)
CO2: 28 mEq/L (ref 19–32)
Chloride: 96 mEq/L (ref 96–112)
Creatinine, Ser: 4.57 mg/dL — ABNORMAL HIGH (ref 0.50–1.35)
Glucose, Bld: 149 mg/dL — ABNORMAL HIGH (ref 70–99)

## 2012-03-26 LAB — CBC WITH DIFFERENTIAL/PLATELET
Eosinophils Absolute: 0.1 10*3/uL (ref 0.0–0.7)
Eosinophils Relative: 1 % (ref 0–5)
Hemoglobin: 7.9 g/dL — ABNORMAL LOW (ref 13.0–17.0)
Lymphs Abs: 1.8 10*3/uL (ref 0.7–4.0)
MCH: 27.9 pg (ref 26.0–34.0)
MCV: 86.6 fL (ref 78.0–100.0)
Monocytes Relative: 6 % (ref 3–12)
Neutrophils Relative %: 74 % (ref 43–77)
RBC: 2.83 MIL/uL — ABNORMAL LOW (ref 4.22–5.81)

## 2012-03-27 ENCOUNTER — Other Ambulatory Visit (HOSPITAL_COMMUNITY): Payer: Medicare Other

## 2012-03-27 LAB — BASIC METABOLIC PANEL
BUN: 42 mg/dL — ABNORMAL HIGH (ref 6–23)
Chloride: 95 mEq/L — ABNORMAL LOW (ref 96–112)
Creatinine, Ser: 3.12 mg/dL — ABNORMAL HIGH (ref 0.50–1.35)
GFR calc Af Amer: 21 mL/min — ABNORMAL LOW (ref >90)
Glucose, Bld: 138 mg/dL — ABNORMAL HIGH (ref 70–99)

## 2012-03-27 LAB — CBC
HCT: 27.3 % — ABNORMAL LOW (ref 39.0–52.0)
MCV: 85.6 fL (ref 78.0–100.0)
RDW: 18.6 % — ABNORMAL HIGH (ref 11.5–15.5)
WBC: 7.7 10*3/uL (ref 4.0–10.5)

## 2012-03-28 LAB — RENAL FUNCTION PANEL
Albumin: 2.2 g/dL — ABNORMAL LOW (ref 3.5–5.2)
CO2: 30 mEq/L (ref 19–32)
Chloride: 93 mEq/L — ABNORMAL LOW (ref 96–112)
GFR calc Af Amer: 15 mL/min — ABNORMAL LOW (ref >90)
GFR calc non Af Amer: 13 mL/min — ABNORMAL LOW (ref >90)
Potassium: 4.4 mEq/L (ref 3.5–5.1)
Sodium: 135 mEq/L (ref 135–145)

## 2012-03-28 LAB — CBC
HCT: 25.9 % — ABNORMAL LOW (ref 39.0–52.0)
Hemoglobin: 8.2 g/dL — ABNORMAL LOW (ref 13.0–17.0)
MCH: 26.7 pg (ref 26.0–34.0)
MCHC: 31.7 g/dL (ref 30.0–36.0)
MCV: 84.4 fL (ref 78.0–100.0)

## 2012-03-31 LAB — RENAL FUNCTION PANEL
Albumin: 2.3 g/dL — ABNORMAL LOW (ref 3.5–5.2)
BUN: 51 mg/dL — ABNORMAL HIGH (ref 6–23)
CO2: 30 mEq/L (ref 19–32)
Calcium: 9.4 mg/dL (ref 8.4–10.5)
Creatinine, Ser: 3.29 mg/dL — ABNORMAL HIGH (ref 0.50–1.35)
GFR calc non Af Amer: 13 mL/min — ABNORMAL LOW (ref >90)
GFR calc non Af Amer: 17 mL/min — ABNORMAL LOW (ref >90)
Phosphorus: 4.7 mg/dL — ABNORMAL HIGH (ref 2.3–4.6)
Phosphorus: 3.8 mg/dL (ref 2.3–4.6)
Potassium: 4.3 mEq/L (ref 3.5–5.1)
Sodium: 135 mEq/L (ref 135–145)

## 2012-03-31 LAB — CBC
HCT: 22.3 % — ABNORMAL LOW (ref 39.0–52.0)
MCHC: 32.7 g/dL (ref 30.0–36.0)
Platelets: 352 10*3/uL (ref 150–400)
RDW: 17.9 % — ABNORMAL HIGH (ref 11.5–15.5)

## 2012-03-31 LAB — PROCALCITONIN: Procalcitonin: 0.59 ng/mL

## 2012-04-01 ENCOUNTER — Other Ambulatory Visit (HOSPITAL_COMMUNITY): Payer: Medicare Other

## 2012-04-01 LAB — RENAL FUNCTION PANEL
Albumin: 2.3 g/dL — ABNORMAL LOW (ref 3.5–5.2)
Chloride: 97 mEq/L (ref 96–112)
GFR calc non Af Amer: 13 mL/min — ABNORMAL LOW (ref >90)
Potassium: 4.5 mEq/L (ref 3.5–5.1)

## 2012-04-01 LAB — PREALBUMIN: Prealbumin: 11.7 mg/dL — ABNORMAL LOW (ref 17.0–34.0)

## 2012-04-01 LAB — HEMOGLOBIN AND HEMATOCRIT, BLOOD: HCT: 23.7 % — ABNORMAL LOW (ref 39.0–52.0)

## 2012-04-01 LAB — PROCALCITONIN: Procalcitonin: 0.61 ng/mL

## 2012-04-02 ENCOUNTER — Other Ambulatory Visit (HOSPITAL_COMMUNITY): Payer: Medicare Other

## 2012-04-02 LAB — RENAL FUNCTION PANEL
CO2: 30 mEq/L (ref 19–32)
Calcium: 9.4 mg/dL (ref 8.4–10.5)
Chloride: 94 mEq/L — ABNORMAL LOW (ref 96–112)
GFR calc Af Amer: 12 mL/min — ABNORMAL LOW (ref >90)
GFR calc non Af Amer: 10 mL/min — ABNORMAL LOW (ref >90)
Sodium: 138 mEq/L (ref 135–145)

## 2012-04-02 LAB — CBC WITH DIFFERENTIAL/PLATELET
Basophils Absolute: 0 10*3/uL (ref 0.0–0.1)
Lymphocytes Relative: 21 % (ref 12–46)
Neutro Abs: 5.2 10*3/uL (ref 1.7–7.7)
Neutrophils Relative %: 72 % (ref 43–77)
Platelets: 328 10*3/uL (ref 150–400)
RDW: 18.1 % — ABNORMAL HIGH (ref 11.5–15.5)
WBC: 7.2 10*3/uL (ref 4.0–10.5)

## 2012-04-02 NOTE — Progress Notes (Signed)
Patient: Derrick [name] DOB: [birth year] Date of Admission: 03/01/2012            Pulmonary consult  Date of Consult: 04/02/2012 MD requesting consult: [facility] MD Reason for consult:  ??decannulation   HPI -  71yo male with hx chronic renal failure, HTN, DM, CHF who was admitted from SNF to [facility] hospital on 6/24 with AMS.  He was found to have urinary retention and MRSA/Ecoli UTI with septic shock.  He was intubated, required pressors and dialysis.  He was initially unable to wean from vent and tracheostomy was placed.   Course was c/b acute on chronic renal failure, CDiff and acute on chronic diastolic dysfunction.  He has since been weaned from the vent and has tolerated ATC at all times since admission to [facility] 7/5 for further abx and rehab.  He is improving overall and nearing discharge planning.  However his dialysis has become chronic and placement options are limited, so PCCM has been asked to eval for potential decannulation again.  We signed off 8/9 and stated no decannulation then,.  chest X-ray 7/27>> pulm edema   CT head 7/29>> Dg Chest [facility] 1 View  04/02/2012  *RADIOLOGY REPORT*  Clinical Data: Respiratory distress  PORTABLE CHEST - 1 VIEW  Comparison: 04/01/2012  Findings: Tracheostomy tube tip is above the carina.  Heart size is enlarged, unchanged from previous exam.  Left pleural effusion and/or left lower lobe airspace disease is not significantly changed from previous exam.  Persistent very low lung volumes identified.  IMPRESSION:  1.  No change in aeration to the left lung compared with previous exam. 2.  Low lung volumes   Original Report Authenticated By: Rosealee Albee, M.D.    Dg Chest Port 1 View  04/01/2012  *RADIOLOGY REPORT*  Clinical Data: Respiratory distress  PORTABLE CHEST - 1 VIEW  Comparison: 03/27/2012  Findings: Stable mild cardiomegaly.  Stable tracheostomy.  Stable edema left greater than right.  Stable left pleural effusion.  IMPRESSION: Stable.    Original Report Authenticated By: Donavan Burnet, M.D.      CBC    Component Value Date/Time   WBC 7.2 04/02/2012 0605   RBC 2.68* 04/02/2012 0605   HGB 7.2* 04/02/2012 0605   HCT 23.0* 04/02/2012 0605   PLT 328 04/02/2012 0605   MCV 85.8 04/02/2012 0605   MCH 26.9 04/02/2012 0605   MCHC 31.3 04/02/2012 0605   RDW 18.1* 04/02/2012 0605   LYMPHSABS 1.5 04/02/2012 0605   MONOABS 0.5 04/02/2012 0605   EOSABS 0.1 04/02/2012 0605   BASOSABS 0.0 04/02/2012 0605     BMET    Component Value Date/Time   NA 138 04/02/2012 0605   K 4.7 04/02/2012 0605   CL 94* 04/02/2012 0605   CO2 30 04/02/2012 0605   GLUCOSE 140* 04/02/2012 0605   BUN 59* 04/02/2012 0605   CREATININE 5.16* 04/02/2012 0605   CALCIUM 9.4 04/02/2012 0605   CALCIUM 8.7 03/19/2012 0535   GFRNONAA 10* 04/02/2012 0605   GFRAA 12* 04/02/2012 0605     EXAM: General:  Chronically ill appearing male, NAD in bed  Neuro:  Drowsy, arousable, opens eyes but minimally interactive.  Grunts, occasionally nods head yes/no.  [name], gen weakness CV: s1s2 rrr PULM: resps even non labored on ATC with PMV.  #6cuffless trach in place  GI: abd soft, +bs Extremities:  Warm and dry, no edema   IMPRESSION/ PLAN:  Chronic trach dependent respiratory failure -- trach initially placed r/t  inability to wean from vent in setting UTI with septic shock, acute on chronic renal failure and now likely ESRD.   Pt remains severely deconditioned and mental status remains an issue at times.  PLAN -  Cont ATC as tolerated with PMV and po diet as tol  Would not consider decannulation until mental status a little clearer and overall deconditioning improved.   Keep dry as tol with HD Cont pt/ot  Doubt decannulation given neuro status.  Would not decannulate without significant improvement in mental status and physical conditioning.  PCCM will sign off, please call back if needed.  Shan Levans Beeper  [phone]  Cell  [phone]  If no response or cell goes to  voicemail, call beeper [phone]

## 2012-04-03 LAB — TYPE AND SCREEN
ABO/RH(D): O POS
Antibody Screen: NEGATIVE
Unit division: 0

## 2012-04-03 LAB — RENAL FUNCTION PANEL
BUN: 36 mg/dL — ABNORMAL HIGH (ref 6–23)
CO2: 33 mEq/L — ABNORMAL HIGH (ref 19–32)
Calcium: 9.4 mg/dL (ref 8.4–10.5)
Chloride: 94 mEq/L — ABNORMAL LOW (ref 96–112)
Creatinine, Ser: 3.36 mg/dL — ABNORMAL HIGH (ref 0.50–1.35)
Glucose, Bld: 122 mg/dL — ABNORMAL HIGH (ref 70–99)

## 2012-04-04 DIAGNOSIS — J9600 Acute respiratory failure, unspecified whether with hypoxia or hypercapnia: Secondary | ICD-10-CM

## 2012-04-04 LAB — RENAL FUNCTION PANEL
BUN: 53 mg/dL — ABNORMAL HIGH (ref 6–23)
CO2: 30 mEq/L (ref 19–32)
Calcium: 9.5 mg/dL (ref 8.4–10.5)
GFR calc Af Amer: 14 mL/min — ABNORMAL LOW (ref >90)
Glucose, Bld: 136 mg/dL — ABNORMAL HIGH (ref 70–99)
Phosphorus: 4.7 mg/dL — ABNORMAL HIGH (ref 2.3–4.6)
Sodium: 133 mEq/L — ABNORMAL LOW (ref 135–145)

## 2012-04-04 LAB — CBC
HCT: 27.7 % — ABNORMAL LOW (ref 39.0–52.0)
Hemoglobin: 8.7 g/dL — ABNORMAL LOW (ref 13.0–17.0)
MCH: 26.7 pg (ref 26.0–34.0)
MCHC: 31.4 g/dL (ref 30.0–36.0)
RBC: 3.26 MIL/uL — ABNORMAL LOW (ref 4.22–5.81)

## 2012-04-04 NOTE — H&P (Signed)
[facility]          Reason for Consult: Complicated hospital stay s/p UTI and  Bacteremia with fever of unknown origin   Referring Physician: Dr. Carron Curie  Active Problems:  Acute respiratory failure with hypoxia  Pneumonia  CHF (congestive heart failure)  Tracheostomy status       Assessment/Plan: Mr. Derrick [name] 71 yo AA male with PMH of gout, DM, HTN, ESRD on HD, diastolic CHF, s/p diagnosed UTI with MRSA AND ESBL E.coli and respiratory failure with tracheostomy done. He has no obvious active infectious process at this time. He also does not have a temperature to merit the diagnosis of FUO. -  d/c Erythromycin which would worsen the C. Diff he had before - Would not recommend any antibiotics as infectious process seems to be highly unlikely.at this time     HPI: Derrick [name] is a 71 y.o. male AA, with history of gout, monoclonal gammopathy of unknown significance, renal failure, diabetes, HTN, diastolic CHF, who was admitted to [facility] on 02/04/12 for AMS and fever.He was in a nursing home facility for rehabilitation due to inability of ambulation from gout. On admission, the patient had urinary retention and was catheterized, per patient's daughter. The patient subsequently was hypotensive with urinalysis significant for WBC fo 505 and blood. The Urine culture grew MRSA and ESBL E.Coli. He was admitted to ICU  And was placed on vasopressors and IVF. He developed respiratory failure and was intubated on  06/25., subsequently he received a tracheostomy on 07/05. During the hospitalization he developed acute on CRF and had to pursue hemodialysis. The patient also developed C.Diff colitis during hospital stay and was on Flagyl. The patient was discharged and readmitted at [facility] on 07/20. He was treated with antibiotics including Invanz, which was discontinued. His urine cultures from 07/20 were negative. During this hospital stay he was  started on dialysis on 06/24 and on HD on MWF, with trach initially placed r/t inability to wean from vent in setting UTI with septic shock, acute on chronic renal failure and now likely ESRD. Pt remains severely deconditioned and mental status remains the same.  Blood culture from 08/06 showed no growth and urine culture from 08/05 showed no growth and tracheal aspirate from 08/09 also showed no growth. C.Diff by PCR from 08/07 was negative. His 2D ECHO on 07/23 showed EF of 45-50%He has  WBC count of 7.2 and T. Max of 100F over last 2 days. He has a Cr of 4.32 with GFR of 14. His procalcitonin levels has been normal with recent one of 0.61. Prealbumin levels from 08/20 are 11.7. He has been on  Vancomycin and Ciprofloxacin. And these were stopped on 08/10.    Review of Systems:   Past Medical History  Diagnosis Date  . Gout   . MGUS (monoclonal gammopathy of unknown significance)   . Chronic renal failure   . Diabetes mellitus   . HTN (hypertension)   . Diastolic congestive heart failure   . Anxiety   . Anemia   . GERD (gastroesophageal reflux disease)     History  Substance Use Topics  . Smoking status: Not on file  . Smokeless tobacco: Not on file  . Alcohol Use: Not on file    No family history on file. Allergies  Allergen Reactions  . Nsaids     OBJECTIVE: There were no vitals taken for this visit.  General: Chronically ill appearing male,  NAD in bed  Neuro: Drowsy, arousable, opens eyes but doesn't answer any questions. CV: RRR, Normal S1/S2, No murmurs PULM:  On trach  GI: abd soft, +bs  Extremities: Warm and dry, no edema, Pulses 2+ b/l    Microbiology: No results found for this or any previous visit (from the past 240 hour(s)).  Donnajean Lopes,  Medical student. 04/04/2012, 11:44 AM   INFECTIOUS DISEASE ATTENDING ADDENDUM:     [facility]   Date: 04/04/2012  Patient name: Derrick [name]  Medical record number:  [id number]  Date of birth: [birth year]    This patient has been seen and discussed with the house staff. Please see their note for complete details. I concur with their findings with the following additions/corrections:  I can find NO evidence for active or chronic infectious process in this patient. I do not find his low-grade temperatures to be of any significance. Certainly patients on ventilators who are deconditioned and weak are going to be prone to low grade temperatures.  This patient is are you having 1 episode of C. difficile colitis to be very important to avoid any unnecessary antibiotics.  --I SEE NO INDICATION FOR ANY ANTIBIOTICS AT THIS POINT IN TIME --I WOULD AVOID ALL UNNECESSARY ABX AND I WOULD CONSIDER DC THE ERYTHROMYCIN (FOR HIS DM GASTROPARESIS) SINCE IT MAY PUT HIM AT RISK FOR CDI  --I WOULD AVOID UNNECESSARY CULTURES OF BLOOD, URINE, SPUTUM SINCE THIS MAY LEAD TO UNNECESSARY TREATMENT OF ASYMPTOMATIC BACTERURIA OR TRACHEAL COLONIZERS OR CONTAMINANTS  --I WILL ORDER AN HIV TEST I HIM FOR SCREENING PURPOSES  I WILL SIGN OFF FOR NOW. PLEASE CALL WITH FURTHER QUESTIONS.  Acey Lav 04/04/2012, 5:04 PM

## 2012-04-07 LAB — RENAL FUNCTION PANEL
BUN: 42 mg/dL — ABNORMAL HIGH (ref 6–23)
CO2: 30 mEq/L (ref 19–32)
GFR calc Af Amer: 23 mL/min — ABNORMAL LOW (ref >90)
Glucose, Bld: 121 mg/dL — ABNORMAL HIGH (ref 70–99)
Potassium: 4 mEq/L (ref 3.5–5.1)
Sodium: 136 mEq/L (ref 135–145)

## 2012-04-07 LAB — CBC
HCT: 27 % — ABNORMAL LOW (ref 39.0–52.0)
Hemoglobin: 8.6 g/dL — ABNORMAL LOW (ref 13.0–17.0)
MCH: 26.7 pg (ref 26.0–34.0)
MCHC: 31.9 g/dL (ref 30.0–36.0)
RBC: 3.22 MIL/uL — ABNORMAL LOW (ref 4.22–5.81)

## 2012-04-07 LAB — HIV ANTIBODY (ROUTINE TESTING W REFLEX): HIV: NONREACTIVE

## 2012-04-09 LAB — CBC
HCT: 28.5 % — ABNORMAL LOW (ref 39.0–52.0)
Hemoglobin: 8.8 g/dL — ABNORMAL LOW (ref 13.0–17.0)
MCV: 85.3 fL (ref 78.0–100.0)
RBC: 3.34 MIL/uL — ABNORMAL LOW (ref 4.22–5.81)
WBC: 9.1 10*3/uL (ref 4.0–10.5)

## 2012-04-09 LAB — RENAL FUNCTION PANEL
Albumin: 2.1 g/dL — ABNORMAL LOW (ref 3.5–5.2)
BUN: 70 mg/dL — ABNORMAL HIGH (ref 6–23)
CO2: 30 mEq/L (ref 19–32)
Chloride: 90 mEq/L — ABNORMAL LOW (ref 96–112)
Creatinine, Ser: 4.39 mg/dL — ABNORMAL HIGH (ref 0.50–1.35)
Glucose, Bld: 133 mg/dL — ABNORMAL HIGH (ref 70–99)
Potassium: 3.9 mEq/L (ref 3.5–5.1)

## 2012-04-11 LAB — CBC
HCT: 27.5 % — ABNORMAL LOW (ref 39.0–52.0)
Hemoglobin: 8.7 g/dL — ABNORMAL LOW (ref 13.0–17.0)
MCV: 84.6 fL (ref 78.0–100.0)
Platelets: 372 10*3/uL (ref 150–400)
RBC: 3.25 MIL/uL — ABNORMAL LOW (ref 4.22–5.81)
WBC: 9.6 10*3/uL (ref 4.0–10.5)

## 2012-04-11 LAB — RENAL FUNCTION PANEL
CO2: 32 mEq/L (ref 19–32)
Chloride: 85 mEq/L — ABNORMAL LOW (ref 96–112)
Creatinine, Ser: 4.15 mg/dL — ABNORMAL HIGH (ref 0.50–1.35)
GFR calc non Af Amer: 13 mL/min — ABNORMAL LOW (ref >90)
Glucose, Bld: 143 mg/dL — ABNORMAL HIGH (ref 70–99)

## 2012-04-12 ENCOUNTER — Institutional Professional Consult (permissible substitution) (HOSPITAL_COMMUNITY): Payer: Medicare Other

## 2012-04-12 LAB — URINE MICROSCOPIC-ADD ON

## 2012-04-12 LAB — URINALYSIS, ROUTINE W REFLEX MICROSCOPIC
Bilirubin Urine: NEGATIVE
Glucose, UA: NEGATIVE mg/dL
Ketones, ur: NEGATIVE mg/dL
pH: 8 (ref 5.0–8.0)

## 2012-04-13 ENCOUNTER — Other Ambulatory Visit (HOSPITAL_COMMUNITY): Payer: Medicare Other

## 2012-04-13 ENCOUNTER — Institutional Professional Consult (permissible substitution) (HOSPITAL_COMMUNITY): Payer: Medicare Other

## 2012-04-13 DIAGNOSIS — M7989 Other specified soft tissue disorders: Secondary | ICD-10-CM

## 2012-04-13 NOTE — Progress Notes (Signed)
Bilateral:  No evidence of DVT, superficial thrombosis, or Baker's Cyst.   

## 2012-04-14 LAB — URINE CULTURE: Colony Count: >=100000

## 2012-04-14 LAB — CBC
MCH: 26.7 pg (ref 26.0–34.0)
MCHC: 31.3 g/dL (ref 30.0–36.0)
MCV: 85.3 fL (ref 78.0–100.0)
Platelets: 400 10*3/uL (ref 150–400)
RDW: 17.7 % — ABNORMAL HIGH (ref 11.5–15.5)
WBC: 7.8 10*3/uL (ref 4.0–10.5)

## 2012-04-14 LAB — RENAL FUNCTION PANEL
Albumin: 2.1 g/dL — ABNORMAL LOW (ref 3.5–5.2)
BUN: 76 mg/dL — ABNORMAL HIGH (ref 6–23)
Phosphorus: 3.8 mg/dL (ref 2.3–4.6)
Potassium: 4 mEq/L (ref 3.5–5.1)

## 2012-04-14 LAB — CULTURE, BLOOD (ROUTINE X 2)

## 2012-04-15 ENCOUNTER — Other Ambulatory Visit (HOSPITAL_COMMUNITY): Payer: Medicare Other

## 2012-04-15 LAB — CBC WITH DIFFERENTIAL/PLATELET
Basophils Absolute: 0 10*3/uL (ref 0.0–0.1)
Basophils Relative: 0 % (ref 0–1)
Hemoglobin: 7.6 g/dL — ABNORMAL LOW (ref 13.0–17.0)
MCHC: 29.7 g/dL — ABNORMAL LOW (ref 30.0–36.0)
Neutro Abs: 4.8 10*3/uL (ref 1.7–7.7)
Neutrophils Relative %: 70 % (ref 43–77)
Platelets: 417 10*3/uL — ABNORMAL HIGH (ref 150–400)
RDW: 17.6 % — ABNORMAL HIGH (ref 11.5–15.5)

## 2012-04-15 LAB — PREALBUMIN: Prealbumin: 11.8 mg/dL — ABNORMAL LOW (ref 17.0–34.0)

## 2012-04-15 LAB — BASIC METABOLIC PANEL
Chloride: 96 mEq/L (ref 96–112)
GFR calc Af Amer: 22 mL/min — ABNORMAL LOW (ref >90)
GFR calc non Af Amer: 19 mL/min — ABNORMAL LOW (ref >90)
Potassium: 4.2 mEq/L (ref 3.5–5.1)
Sodium: 138 mEq/L (ref 135–145)

## 2012-04-15 LAB — CULTURE, RESPIRATORY W GRAM STAIN

## 2012-04-15 LAB — PTH, INTACT AND CALCIUM: Calcium, Total (PTH): 9 mg/dL (ref 8.4–10.5)

## 2012-04-16 DIAGNOSIS — R7881 Bacteremia: Secondary | ICD-10-CM

## 2012-04-16 DIAGNOSIS — N390 Urinary tract infection, site not specified: Secondary | ICD-10-CM

## 2012-04-16 LAB — VANCOMYCIN, TROUGH: Vancomycin Tr: 14.8 ug/mL (ref 10.0–20.0)

## 2012-04-16 LAB — CBC
MCHC: 30.6 g/dL (ref 30.0–36.0)
MCV: 84.9 fL (ref 78.0–100.0)
Platelets: 423 10*3/uL — ABNORMAL HIGH (ref 150–400)
RDW: 17.6 % — ABNORMAL HIGH (ref 11.5–15.5)
WBC: 9.4 10*3/uL (ref 4.0–10.5)

## 2012-04-16 LAB — RENAL FUNCTION PANEL
Albumin: 2.3 g/dL — ABNORMAL LOW (ref 3.5–5.2)
BUN: 69 mg/dL — ABNORMAL HIGH (ref 6–23)
Chloride: 93 mEq/L — ABNORMAL LOW (ref 96–112)
Creatinine, Ser: 3.95 mg/dL — ABNORMAL HIGH (ref 0.50–1.35)
GFR calc non Af Amer: 14 mL/min — ABNORMAL LOW (ref >90)
Phosphorus: 3.4 mg/dL (ref 2.3–4.6)

## 2012-04-18 LAB — RENAL FUNCTION PANEL
Albumin: 2.1 g/dL — ABNORMAL LOW (ref 3.5–5.2)
BUN: 62 mg/dL — ABNORMAL HIGH (ref 6–23)
Calcium: 9.6 mg/dL (ref 8.4–10.5)
Creatinine, Ser: 3.74 mg/dL — ABNORMAL HIGH (ref 0.50–1.35)
Glucose, Bld: 133 mg/dL — ABNORMAL HIGH (ref 70–99)
Phosphorus: 3.2 mg/dL (ref 2.3–4.6)
Potassium: 4 mEq/L (ref 3.5–5.1)

## 2012-04-18 LAB — CBC WITH DIFFERENTIAL/PLATELET
Basophils Absolute: 0.1 10*3/uL (ref 0.0–0.1)
Basophils Relative: 1 % (ref 0–1)
Eosinophils Absolute: 0.1 10*3/uL (ref 0.0–0.7)
Eosinophils Relative: 1 % (ref 0–5)
HCT: 26.7 % — ABNORMAL LOW (ref 39.0–52.0)
Lymphocytes Relative: 15 % (ref 12–46)
MCH: 26.3 pg (ref 26.0–34.0)
MCHC: 30.7 g/dL (ref 30.0–36.0)
MCV: 85.6 fL (ref 78.0–100.0)
Monocytes Absolute: 0.7 10*3/uL (ref 0.1–1.0)
Platelets: 414 10*3/uL — ABNORMAL HIGH (ref 150–400)
RDW: 18 % — ABNORMAL HIGH (ref 11.5–15.5)
WBC: 10.2 10*3/uL (ref 4.0–10.5)

## 2012-04-18 LAB — CULTURE, BLOOD (ROUTINE X 2): Culture: NO GROWTH

## 2012-04-19 ENCOUNTER — Other Ambulatory Visit (HOSPITAL_COMMUNITY): Payer: Medicare Other

## 2012-04-19 LAB — RENAL FUNCTION PANEL
CO2: 30 mEq/L (ref 19–32)
Calcium: 9.9 mg/dL (ref 8.4–10.5)
Creatinine, Ser: 2.79 mg/dL — ABNORMAL HIGH (ref 0.50–1.35)
GFR calc Af Amer: 25 mL/min — ABNORMAL LOW (ref >90)
GFR calc non Af Amer: 21 mL/min — ABNORMAL LOW (ref >90)
Phosphorus: 2.9 mg/dL (ref 2.3–4.6)
Sodium: 134 mEq/L — ABNORMAL LOW (ref 135–145)

## 2012-04-21 ENCOUNTER — Other Ambulatory Visit (HOSPITAL_COMMUNITY): Payer: Medicare Other

## 2012-04-21 LAB — CBC
HCT: 26.1 % — ABNORMAL LOW (ref 39.0–52.0)
MCH: 26.4 pg (ref 26.0–34.0)
MCHC: 30.3 g/dL (ref 30.0–36.0)
MCV: 86.1 fL (ref 78.0–100.0)
Platelets: 397 10*3/uL (ref 150–400)
Platelets: 399 10*3/uL (ref 150–400)
RBC: 3.03 MIL/uL — ABNORMAL LOW (ref 4.22–5.81)
RDW: 17.8 % — ABNORMAL HIGH (ref 11.5–15.5)
WBC: 10 10*3/uL (ref 4.0–10.5)

## 2012-04-21 LAB — TROPONIN I: Troponin I: <0.3 ng/mL (ref <0.30)

## 2012-04-21 LAB — RENAL FUNCTION PANEL
Albumin: 2.2 g/dL — ABNORMAL LOW (ref 3.5–5.2)
BUN: 77 mg/dL — ABNORMAL HIGH (ref 6–23)
Chloride: 89 mEq/L — ABNORMAL LOW (ref 96–112)
Creatinine, Ser: 4.23 mg/dL — ABNORMAL HIGH (ref 0.50–1.35)
Glucose, Bld: 160 mg/dL — ABNORMAL HIGH (ref 70–99)

## 2012-04-21 LAB — BLOOD GAS, ARTERIAL
Acid-Base Excess: 3.3 mmol/L — ABNORMAL HIGH (ref 0.0–2.0)
Bicarbonate: 30 mEq/L — ABNORMAL HIGH (ref 20.0–24.0)
TCO2: 32.1 mmol/L (ref 0–100)
pCO2 arterial: 70.4 mmHg (ref 35.0–45.0)
pO2, Arterial: 61.3 mmHg — ABNORMAL LOW (ref 80.0–100.0)

## 2012-04-21 LAB — BASIC METABOLIC PANEL
Calcium: 10.1 mg/dL (ref 8.4–10.5)
Creatinine, Ser: 4.26 mg/dL — ABNORMAL HIGH (ref 0.50–1.35)
GFR calc non Af Amer: 13 mL/min — ABNORMAL LOW (ref >90)
Sodium: 128 mEq/L — ABNORMAL LOW (ref 135–145)

## 2012-04-21 LAB — CK TOTAL AND CKMB (NOT AT ARMC)
CK, MB: 3.6 ng/mL (ref 0.3–4.0)
Relative Index: INVALID (ref 0.0–2.5)
Total CK: 75 U/L (ref 7–232)

## 2012-04-21 LAB — CULTURE, BLOOD (ROUTINE X 2): Culture: NO GROWTH

## 2012-04-21 LAB — MAGNESIUM: Magnesium: 2.9 mg/dL — ABNORMAL HIGH (ref 1.5–2.5)

## 2012-04-21 NOTE — Progress Notes (Signed)
Patient: Derrick [name] DOB: 04/10/1941 Date of Admission: 03/01/2012            Pulmonary consult  Date of Consult: 04/21/2012 MD requesting consult: [facility] MD Reason for consult:  ??decannulation   HPI -  71yo male with hx chronic renal failure, HTN, DM, CHF who was admitted from SNF to [facility] hospital on 6/24 with AMS.  He was found to have urinary retention and MRSA/Ecoli UTI with septic shock.  He was intubated, required pressors and dialysis.  He was initially unable to wean from vent and tracheostomy was placed.   Course was c/b acute on chronic renal failure, CDiff and acute on chronic diastolic dysfunction.  He has since been weaned from the vent and has tolerated ATC at all times since admission to [facility] 7/5 for further abx and rehab.  He is improving overall and nearing discharge planning.  However his dialysis has become chronic and placement options are limited, so PCCM has been asked to eval for potential decannulation again.  We signed off 8/9 and stated no decannulation then  Asked to evaluate patient for acute combined resp failure on 9/9, placed back on MV   CBC    Component Value Date/Time   WBC 9.5 04/21/2012 0538   RBC 3.03* 04/21/2012 0538   HGB 8.0* 04/21/2012 0538   HCT 26.1* 04/21/2012 0538   PLT 397 04/21/2012 0538   MCV 86.1 04/21/2012 0538   MCH 26.4 04/21/2012 0538   MCHC 30.7 04/21/2012 0538   RDW 18.0* 04/21/2012 0538   LYMPHSABS 1.5 04/18/2012 0558   MONOABS 0.7 04/18/2012 0558   EOSABS 0.1 04/18/2012 0558   BASOSABS 0.1 04/18/2012 0558     BMET    Component Value Date/Time   NA 128* 04/21/2012 0538   K 3.9 04/21/2012 0538   CL 89* 04/21/2012 0538   CO2 30 04/21/2012 0538   GLUCOSE 160* 04/21/2012 0538   BUN 77* 04/21/2012 0538   CREATININE 4.23* 04/21/2012 0538   CALCIUM 9.9 04/21/2012 0538   CALCIUM 9.0 04/11/2012 0740   GFRNONAA 13* 04/21/2012 0538   GFRAA 15* 04/21/2012 0538     EXAM: General:  Chronically ill appearing male, NAD in bed  Neuro:  Drowsy, arousable,  opens eyes but minimally interactive.  Grunts, occasionally nods head yes/no.  [name], gen weakness CV: s1s2 rrr PULM: resps even non labored on ATC with PMV.  #6cuffless trach in place  GI: abd soft, +bs Extremities:  Warm and dry, no edema   IMPRESSION/ PLAN:  Chronic trach dependent respiratory failure -- trach initially placed r/t inability to wean from vent in setting UTI with septic shock, acute on chronic renal failure and now likely ESRD.   Pt remains severely deconditioned and mental status remains an issue at times. Acute decompensation likely due to volume status, ? Sedating meds (no indication that he received) PLAN -  Vent overnight, then retry ATC 9/10 after HD today. Follow ABG and MS Cont pt/ot  Doubt decannulation given neuro status.  Would not decannulate without significant improvement in mental status and physical conditioning.  Levy Pupa, MD, PhD 04/21/2012, 12:18 PM [facility] [phone] or if no answer [phone]

## 2012-04-22 ENCOUNTER — Institutional Professional Consult (permissible substitution) (HOSPITAL_COMMUNITY): Payer: Medicare Other

## 2012-04-22 LAB — BASIC METABOLIC PANEL
Calcium: 9.3 mg/dL (ref 8.4–10.5)
Chloride: 93 mEq/L — ABNORMAL LOW (ref 96–112)
Glucose, Bld: 118 mg/dL — ABNORMAL HIGH (ref 70–99)
Potassium: 3.8 mEq/L (ref 3.5–5.1)

## 2012-04-22 LAB — CULTURE, BLOOD (ROUTINE X 2)

## 2012-04-23 LAB — RENAL FUNCTION PANEL
CO2: 29 mEq/L (ref 19–32)
Calcium: 9.3 mg/dL (ref 8.4–10.5)
Creatinine, Ser: 4.24 mg/dL — ABNORMAL HIGH (ref 0.50–1.35)
GFR calc non Af Amer: 13 mL/min — ABNORMAL LOW (ref >90)
Glucose, Bld: 135 mg/dL — ABNORMAL HIGH (ref 70–99)

## 2012-04-23 LAB — CBC
MCH: 26.7 pg (ref 26.0–34.0)
MCV: 83.5 fL (ref 78.0–100.0)
Platelets: 348 10*3/uL (ref 150–400)
RBC: 2.85 MIL/uL — ABNORMAL LOW (ref 4.22–5.81)

## 2012-04-23 LAB — EXPECTORATED SPUTUM ASSESSMENT W GRAM STAIN, RFLX TO RESP C

## 2012-04-23 NOTE — Progress Notes (Signed)
Patient: Derrick [name] DOB: June 29, 1941 Date of Admission: 03/01/2012            Pulmonary consult  Date of Consult: 04/23/2012 MD requesting consult: [facility] Reason for consult:  ??decannulation   HPI -  71yo male with hx chronic renal failure, HTN, DM, CHF who was admitted from SNF to [facility] hospital on 6/24 with AMS.  He was found to have urinary retention and MRSA/Ecoli UTI with septic shock.  He was intubated, required pressors and dialysis.  He was initially unable to wean from vent and tracheostomy was placed.   Course was c/b acute on chronic renal failure, CDiff and acute on chronic diastolic dysfunction.  He has since been weaned from the vent and has tolerated ATC at all times since admission to [facility] 7/5 for further abx and rehab.  He is improving overall and nearing discharge planning.  However his dialysis has become chronic and placement options are limited, so PCCM has been asked to eval for potential decannulation again.  We signed off 8/9 and stated no decannulation then  Asked to evaluate patient for acute combined resp failure on 9/9, placed back on MV   CBC    Component Value Date/Time   WBC 8.8 04/23/2012 0800   RBC 2.85* 04/23/2012 0800   HGB 7.6* 04/23/2012 0800   HCT 23.8* 04/23/2012 0800   PLT 348 04/23/2012 0800   MCV 83.5 04/23/2012 0800   MCH 26.7 04/23/2012 0800   MCHC 31.9 04/23/2012 0800   RDW 17.9* 04/23/2012 0800   LYMPHSABS 1.5 04/18/2012 0558   MONOABS 0.7 04/18/2012 0558   EOSABS 0.1 04/18/2012 0558   BASOSABS 0.1 04/18/2012 0558     BMET    Component Value Date/Time   NA 131* 04/23/2012 0800   K 3.4* 04/23/2012 0800   CL 92* 04/23/2012 0800   CO2 29 04/23/2012 0800   GLUCOSE 135* 04/23/2012 0800   BUN 70* 04/23/2012 0800   CREATININE 4.24* 04/23/2012 0800   CALCIUM 9.3 04/23/2012 0800   CALCIUM 9.0 04/11/2012 0740   GFRNONAA 13* 04/23/2012 0800   GFRAA 15* 04/23/2012 0800     EXAM: General:  Chronically ill appearing male, NAD in bed  Neuro:   Drowsy, arousable, opens eyes but minimally interactive.  Grunts, occasionally nods head yes/no.  [name], gen weakness CV: s1s2 rrr PULM: resps even non labored on ATC with PMV.  #6cuffless trach in place  GI: abd soft, +bs Extremities:  Warm and dry, no edema   IMPRESSION/ PLAN:  Chronic trach dependent respiratory failure -- trach initially placed r/t inability to wean from vent in setting UTI with septic shock, acute on chronic renal failure and now likely ESRD.   Pt remains severely deconditioned and mental status remains an issue at times. Acute decompensation likely due to volume status, ? Sedating meds (no indication that he received) PLAN -  Vent overnight, then retry ATC as tolerated. Follow ABG and MS and c x r Cont pt/ot  Doubt decannulation given neuro status.  Would not decannulate without significant improvement in mental status and physical conditioning.  Brett Canales Minor ACNP [facility] Pager [phone] till 3 pm If no answer page [phone] 04/23/2012, 11:09 AM   Attending Addendum:  I have seen the patient, discussed the issues, test results and plans with S. Minor, NP. I agree with the Assessment and Plans as ammended above.   Levy Pupa, MD, PhD 04/23/2012, 11:54 AM [facility] [phone] or if  no answer [phone]

## 2012-04-24 LAB — HEMOGLOBIN AND HEMATOCRIT, BLOOD
HCT: 27.5 % — ABNORMAL LOW (ref 39.0–52.0)
Hemoglobin: 8.7 g/dL — ABNORMAL LOW (ref 13.0–17.0)

## 2012-04-25 ENCOUNTER — Other Ambulatory Visit (HOSPITAL_COMMUNITY): Payer: Medicare Other

## 2012-04-25 LAB — RENAL FUNCTION PANEL
BUN: 68 mg/dL — ABNORMAL HIGH (ref 6–23)
CO2: 31 mEq/L (ref 19–32)
Chloride: 91 mEq/L — ABNORMAL LOW (ref 96–112)
Glucose, Bld: 116 mg/dL — ABNORMAL HIGH (ref 70–99)
Potassium: 3.4 mEq/L — ABNORMAL LOW (ref 3.5–5.1)

## 2012-04-25 LAB — CBC
HCT: 24.1 % — ABNORMAL LOW (ref 39.0–52.0)
Hemoglobin: 7.5 g/dL — ABNORMAL LOW (ref 13.0–17.0)
MCV: 83.7 fL (ref 78.0–100.0)
RBC: 2.88 MIL/uL — ABNORMAL LOW (ref 4.22–5.81)
WBC: 7.3 10*3/uL (ref 4.0–10.5)

## 2012-04-26 LAB — TYPE AND SCREEN
Antibody Screen: NEGATIVE
Unit division: 0

## 2012-04-26 LAB — CULTURE, RESPIRATORY W GRAM STAIN: Gram Stain: NONE SEEN

## 2012-04-27 LAB — CBC
HCT: 25.3 % — ABNORMAL LOW (ref 39.0–52.0)
MCHC: 31.6 g/dL (ref 30.0–36.0)
MCV: 84.1 fL (ref 78.0–100.0)
RDW: 18 % — ABNORMAL HIGH (ref 11.5–15.5)
WBC: 6.8 10*3/uL (ref 4.0–10.5)

## 2012-04-27 LAB — BASIC METABOLIC PANEL
BUN: 65 mg/dL — ABNORMAL HIGH (ref 6–23)
Chloride: 91 mEq/L — ABNORMAL LOW (ref 96–112)
Creatinine, Ser: 3.97 mg/dL — ABNORMAL HIGH (ref 0.50–1.35)
GFR calc Af Amer: 16 mL/min — ABNORMAL LOW (ref >90)

## 2012-04-28 LAB — RENAL FUNCTION PANEL
Albumin: 1.9 g/dL — ABNORMAL LOW (ref 3.5–5.2)
BUN: 80 mg/dL — ABNORMAL HIGH (ref 6–23)
Calcium: 9.3 mg/dL (ref 8.4–10.5)
Creatinine, Ser: 4.72 mg/dL — ABNORMAL HIGH (ref 0.50–1.35)
Phosphorus: 2.7 mg/dL (ref 2.3–4.6)
Potassium: 3.7 mEq/L (ref 3.5–5.1)

## 2012-04-28 LAB — CBC
HCT: 23.3 % — ABNORMAL LOW (ref 39.0–52.0)
MCHC: 31.8 g/dL (ref 30.0–36.0)
RDW: 18.1 % — ABNORMAL HIGH (ref 11.5–15.5)

## 2012-04-28 NOTE — Progress Notes (Signed)
Patient: Derrick [name] DOB: [birth year] Date of Admission: 03/01/2012            Pulmonary consult  Date of Consult: 04/28/2012 MD requesting consult: [facility] Reason for consult:  ??decannulation   HPI -  71yo male with hx chronic renal failure, HTN, DM, CHF who was admitted from SNF to [facility] hospital on 6/24 with AMS.  He was found to have urinary retention and MRSA/Ecoli UTI with septic shock.  He was intubated, required pressors and dialysis.  He was initially unable to wean from vent and tracheostomy was placed.   Course was c/b acute on chronic renal failure, CDiff and acute on chronic diastolic dysfunction.  He has since been weaned from the vent and has tolerated ATC at all times since admission to [facility] 7/5 for further abx and rehab.  He is improving overall and nearing discharge planning.  However his dialysis has become chronic and placement options are limited, so PCCM has been asked to eval for potential decannulation again.  We signed off 8/9 and stated no decannulation then  EVENTS  9/9 - Asked to evaluate patient for acute combined resp failure on 9/9, placed back on MV 9/16 - tolerating ATC, 3.5kgs removed during HD   CBC  Lab 04/28/12 0730 04/27/12 0616 04/25/12 0602  HGB 7.4* 8.0* 7.5*  HCT 23.3* 25.3* 24.1*  WBC 6.3 6.8 7.3  PLT 249 277 318    BMET  Lab 04/28/12 0730 04/27/12 0616 04/25/12 0602 04/23/12 0800 04/22/12 0455  NA 128* 129* 130* 131* 133*  K 3.7 3.3* -- -- --  CL 89* 91* 91* 92* 93*  CO2 29 31 31 29 28   GLUCOSE 106* 120* 116* 135* 118*  BUN 80* 65* 68* 70* 58*  CREATININE 4.72* 3.97* 4.20* 4.24* 3.47*  CALCIUM 9.3 9.3 9.4 9.3 9.3  MG -- -- -- -- --  PHOS 2.7 -- 2.4 2.2* --      EXAM: General:  Chronically ill appearing male, NAD in bed  Neuro:  Drowsy, arousable, opens eyes but minimally interactive.  Grunts, occasionally nods head yes/no.  [name], gen weakness CV: s1s2 rrr PULM: resps even non labored on ATC with PMV.  #6  trach GI: abd soft, +bs Extremities:  Warm and dry, no edema   IMPRESSION/ PLAN:  Assessment: Chronic trach dependent respiratory failure -trach initially placed r/t inability to wean from vent in setting UTI with septic shock, acute on chronic renal failure and now likely ESRD.   Pt remains severely deconditioned and mental status remains an issue at times. Acute decompensation likely due to volume status, ? Sedating meds (no indication that he received)   PLAN -  -continue ATC as tolerated -need to minimize IV fluids, keep as dry as tolerated -Cont pt/ot  -Doubt decannulation given neuro status.  Would not decannulate without significant improvement in mental status and physical conditioning.   PCCM will sign off.  Please call for further needs.  Thank you for the consult.    Canary Brim, NP-C [facility] Pgr: [phone] or [phone]  Reviewed above, examined pt, and agree with assessment/plan.  Mental status and secretions control are main barrier to de-cannulation. Recommend keeping trach in place until mental status improves. Will sign off for now.  Please call again if pt's mental status improves, and then PCCM can re-assess if he can proceed with decannulation.  Coralyn Helling, MD [facility] 04/28/2012, 12:19 PM Pager:  [phone] After 3pm call: [phone]

## 2012-04-29 ENCOUNTER — Other Ambulatory Visit (HOSPITAL_COMMUNITY): Payer: Medicare Other

## 2012-04-29 LAB — BASIC METABOLIC PANEL
CO2: 31 mEq/L (ref 19–32)
Calcium: 9.3 mg/dL (ref 8.4–10.5)
Chloride: 94 mEq/L — ABNORMAL LOW (ref 96–112)
Creatinine, Ser: 3.76 mg/dL — ABNORMAL HIGH (ref 0.50–1.35)
Glucose, Bld: 109 mg/dL — ABNORMAL HIGH (ref 70–99)
Sodium: 133 mEq/L — ABNORMAL LOW (ref 135–145)

## 2012-04-29 LAB — CBC WITH DIFFERENTIAL/PLATELET
Eosinophils Relative: 2 % (ref 0–5)
HCT: 25.5 % — ABNORMAL LOW (ref 39.0–52.0)
Hemoglobin: 7.8 g/dL — ABNORMAL LOW (ref 13.0–17.0)
Lymphocytes Relative: 19 % (ref 12–46)
Lymphs Abs: 1 10*3/uL (ref 0.7–4.0)
MCV: 85.9 fL (ref 78.0–100.0)
Monocytes Absolute: 0.5 10*3/uL (ref 0.1–1.0)
Neutro Abs: 4 10*3/uL (ref 1.7–7.7)
RBC: 2.97 MIL/uL — ABNORMAL LOW (ref 4.22–5.81)
WBC: 5.6 10*3/uL (ref 4.0–10.5)

## 2012-04-30 LAB — CBC
HCT: 24.6 % — ABNORMAL LOW (ref 39.0–52.0)
Hemoglobin: 7.6 g/dL — ABNORMAL LOW (ref 13.0–17.0)
MCV: 86.9 fL (ref 78.0–100.0)
RDW: 18.8 % — ABNORMAL HIGH (ref 11.5–15.5)
WBC: 6.1 10*3/uL (ref 4.0–10.5)

## 2012-04-30 LAB — RENAL FUNCTION PANEL
Albumin: 2 g/dL — ABNORMAL LOW (ref 3.5–5.2)
BUN: 44 mg/dL — ABNORMAL HIGH (ref 6–23)
Chloride: 93 mEq/L — ABNORMAL LOW (ref 96–112)
Creatinine, Ser: 3.15 mg/dL — ABNORMAL HIGH (ref 0.50–1.35)
Glucose, Bld: 91 mg/dL (ref 70–99)
Potassium: 3.5 mEq/L (ref 3.5–5.1)

## 2012-04-30 LAB — HEPATITIS B SURFACE ANTIGEN: Hepatitis B Surface Ag: NEGATIVE

## 2012-05-01 LAB — RENAL FUNCTION PANEL
BUN: 41 mg/dL — ABNORMAL HIGH (ref 6–23)
CO2: 31 mEq/L (ref 19–32)
Calcium: 9.7 mg/dL (ref 8.4–10.5)
Chloride: 92 mEq/L — ABNORMAL LOW (ref 96–112)
Creatinine, Ser: 2.95 mg/dL — ABNORMAL HIGH (ref 0.50–1.35)
GFR calc non Af Amer: 20 mL/min — ABNORMAL LOW (ref >90)
Glucose, Bld: 116 mg/dL — ABNORMAL HIGH (ref 70–99)

## 2012-05-01 LAB — CBC WITH DIFFERENTIAL/PLATELET
Basophils Absolute: 0 10*3/uL (ref 0.0–0.1)
Eosinophils Relative: 1 % (ref 0–5)
HCT: 29.8 % — ABNORMAL LOW (ref 39.0–52.0)
Hemoglobin: 9.1 g/dL — ABNORMAL LOW (ref 13.0–17.0)
Lymphocytes Relative: 19 % (ref 12–46)
Lymphs Abs: 1.6 10*3/uL (ref 0.7–4.0)
MCV: 87.6 fL (ref 78.0–100.0)
Monocytes Absolute: 0.9 10*3/uL (ref 0.1–1.0)
Monocytes Relative: 11 % (ref 3–12)
Neutro Abs: 6 10*3/uL (ref 1.7–7.7)
RBC: 3.4 MIL/uL — ABNORMAL LOW (ref 4.22–5.81)
RDW: 19.1 % — ABNORMAL HIGH (ref 11.5–15.5)
WBC: 8.7 10*3/uL (ref 4.0–10.5)

## 2012-05-02 ENCOUNTER — Other Ambulatory Visit (HOSPITAL_COMMUNITY): Payer: Medicare Other

## 2012-05-02 LAB — TYPE AND SCREEN
ABO/RH(D): O POS
Unit division: 0

## 2012-05-02 LAB — CBC
HCT: 25.3 % — ABNORMAL LOW (ref 39.0–52.0)
MCHC: 30.4 g/dL (ref 30.0–36.0)
MCV: 86.1 fL (ref 78.0–100.0)
RDW: 19.6 % — ABNORMAL HIGH (ref 11.5–15.5)
WBC: 7.5 10*3/uL (ref 4.0–10.5)

## 2012-05-02 LAB — RENAL FUNCTION PANEL
Albumin: 2 g/dL — ABNORMAL LOW (ref 3.5–5.2)
BUN: 59 mg/dL — ABNORMAL HIGH (ref 6–23)
Chloride: 91 mEq/L — ABNORMAL LOW (ref 96–112)
Creatinine, Ser: 3.91 mg/dL — ABNORMAL HIGH (ref 0.50–1.35)
Glucose, Bld: 112 mg/dL — ABNORMAL HIGH (ref 70–99)

## 2012-05-03 LAB — BASIC METABOLIC PANEL
CO2: 27 mEq/L (ref 19–32)
Chloride: 88 mEq/L — ABNORMAL LOW (ref 96–112)
Creatinine, Ser: 3.26 mg/dL — ABNORMAL HIGH (ref 0.50–1.35)
Potassium: 3.4 mEq/L — ABNORMAL LOW (ref 3.5–5.1)

## 2012-05-03 LAB — HEMOGLOBIN AND HEMATOCRIT, BLOOD: HCT: 26.5 % — ABNORMAL LOW (ref 39.0–52.0)

## 2012-05-04 LAB — BASIC METABOLIC PANEL
CO2: 30 mEq/L (ref 19–32)
Calcium: 9.6 mg/dL (ref 8.4–10.5)
Chloride: 87 mEq/L — ABNORMAL LOW (ref 96–112)
Glucose, Bld: 130 mg/dL — ABNORMAL HIGH (ref 70–99)
Potassium: 3.5 mEq/L (ref 3.5–5.1)
Sodium: 128 mEq/L — ABNORMAL LOW (ref 135–145)

## 2012-05-05 LAB — CBC
HCT: 26.6 % — ABNORMAL LOW (ref 39.0–52.0)
Hemoglobin: 8.3 g/dL — ABNORMAL LOW (ref 13.0–17.0)
MCH: 27.2 pg (ref 26.0–34.0)
MCV: 87.2 fL (ref 78.0–100.0)
RBC: 3.05 MIL/uL — ABNORMAL LOW (ref 4.22–5.81)
WBC: 6.1 10*3/uL (ref 4.0–10.5)

## 2012-05-05 LAB — RENAL FUNCTION PANEL
BUN: 71 mg/dL — ABNORMAL HIGH (ref 6–23)
CO2: 27 mEq/L (ref 19–32)
Calcium: 9.3 mg/dL (ref 8.4–10.5)
Chloride: 87 mEq/L — ABNORMAL LOW (ref 96–112)
Creatinine, Ser: 4.52 mg/dL — ABNORMAL HIGH (ref 0.50–1.35)
Glucose, Bld: 133 mg/dL — ABNORMAL HIGH (ref 70–99)

## 2012-05-06 LAB — URINALYSIS, ROUTINE W REFLEX MICROSCOPIC
Nitrite: NEGATIVE
Specific Gravity, Urine: 1.025 (ref 1.005–1.030)
Urobilinogen, UA: 1 mg/dL (ref 0.0–1.0)
pH: 7 (ref 5.0–8.0)

## 2012-05-06 LAB — URINE MICROSCOPIC-ADD ON

## 2012-05-07 LAB — RENAL FUNCTION PANEL
Albumin: 2.1 g/dL — ABNORMAL LOW (ref 3.5–5.2)
BUN: 69 mg/dL — ABNORMAL HIGH (ref 6–23)
Chloride: 90 mEq/L — ABNORMAL LOW (ref 96–112)
Creatinine, Ser: 4.48 mg/dL — ABNORMAL HIGH (ref 0.50–1.35)
GFR calc non Af Amer: 12 mL/min — ABNORMAL LOW (ref >90)
Phosphorus: 3.5 mg/dL (ref 2.3–4.6)

## 2012-05-07 LAB — CBC
HCT: 26.9 % — ABNORMAL LOW (ref 39.0–52.0)
MCHC: 31.6 g/dL (ref 30.0–36.0)
MCV: 86.5 fL (ref 78.0–100.0)
Platelets: 257 10*3/uL (ref 150–400)
RDW: 20.9 % — ABNORMAL HIGH (ref 11.5–15.5)
WBC: 7.3 10*3/uL (ref 4.0–10.5)

## 2012-05-07 LAB — CULTURE, BLOOD (ROUTINE X 2): Culture: NO GROWTH

## 2012-05-08 LAB — RENAL FUNCTION PANEL
Albumin: 2.1 g/dL — ABNORMAL LOW (ref 3.5–5.2)
BUN: 52 mg/dL — ABNORMAL HIGH (ref 6–23)
Calcium: 9.2 mg/dL (ref 8.4–10.5)
Creatinine, Ser: 3.42 mg/dL — ABNORMAL HIGH (ref 0.50–1.35)
Glucose, Bld: 135 mg/dL — ABNORMAL HIGH (ref 70–99)
Phosphorus: 3.1 mg/dL (ref 2.3–4.6)

## 2012-05-09 LAB — CBC
HCT: 28.5 % — ABNORMAL LOW (ref 39.0–52.0)
MCH: 27.4 pg (ref 26.0–34.0)
MCHC: 31.6 g/dL (ref 30.0–36.0)
MCV: 86.9 fL (ref 78.0–100.0)
Platelets: 276 10*3/uL (ref 150–400)
RDW: 21.4 % — ABNORMAL HIGH (ref 11.5–15.5)

## 2012-05-09 LAB — RENAL FUNCTION PANEL
Albumin: 2.2 g/dL — ABNORMAL LOW (ref 3.5–5.2)
BUN: 71 mg/dL — ABNORMAL HIGH (ref 6–23)
Calcium: 9.1 mg/dL (ref 8.4–10.5)
Chloride: 88 mEq/L — ABNORMAL LOW (ref 96–112)
Creatinine, Ser: 4.37 mg/dL — ABNORMAL HIGH (ref 0.50–1.35)
Phosphorus: 3.9 mg/dL (ref 2.3–4.6)

## 2012-05-10 LAB — RENAL FUNCTION PANEL
CO2: 31 mEq/L (ref 19–32)
Chloride: 94 mEq/L — ABNORMAL LOW (ref 96–112)
Creatinine, Ser: 3.44 mg/dL — ABNORMAL HIGH (ref 0.50–1.35)
GFR calc non Af Amer: 16 mL/min — ABNORMAL LOW (ref >90)
Glucose, Bld: 152 mg/dL — ABNORMAL HIGH (ref 70–99)

## 2012-05-11 LAB — BLOOD GAS, ARTERIAL
FIO2: 0.4 %
O2 Saturation: 95.2 %
Patient temperature: 98.6
pH, Arterial: 7.2 — ABNORMAL LOW (ref 7.350–7.450)
pO2, Arterial: 83.4 mmHg (ref 80.0–100.0)

## 2012-05-12 LAB — RENAL FUNCTION PANEL
Albumin: 2.2 g/dL — ABNORMAL LOW (ref 3.5–5.2)
Calcium: 8.9 mg/dL (ref 8.4–10.5)
GFR calc Af Amer: 12 mL/min — ABNORMAL LOW (ref >90)
Phosphorus: 4.4 mg/dL (ref 2.3–4.6)
Potassium: 3.8 mEq/L (ref 3.5–5.1)
Sodium: 131 mEq/L — ABNORMAL LOW (ref 135–145)

## 2012-05-12 LAB — CBC
MCHC: 31 g/dL (ref 30.0–36.0)
RDW: 21.5 % — ABNORMAL HIGH (ref 11.5–15.5)

## 2012-05-12 LAB — PTH, INTACT AND CALCIUM: Calcium, Total (PTH): 8.7 mg/dL (ref 8.4–10.5)

## 2012-05-14 LAB — CULTURE, RESPIRATORY W GRAM STAIN

## 2012-05-14 LAB — CBC
MCHC: 31.9 g/dL (ref 30.0–36.0)
MCV: 86.8 fL (ref 78.0–100.0)
Platelets: 246 10*3/uL (ref 150–400)
RDW: 20.8 % — ABNORMAL HIGH (ref 11.5–15.5)
WBC: 5.9 10*3/uL (ref 4.0–10.5)

## 2012-05-14 LAB — RENAL FUNCTION PANEL
Albumin: 2 g/dL — ABNORMAL LOW (ref 3.5–5.2)
Chloride: 95 mEq/L — ABNORMAL LOW (ref 96–112)
Creatinine, Ser: 3.17 mg/dL — ABNORMAL HIGH (ref 0.50–1.35)
GFR calc non Af Amer: 18 mL/min — ABNORMAL LOW (ref >90)
Phosphorus: 3.1 mg/dL (ref 2.3–4.6)

## 2012-05-16 LAB — CBC
MCV: 85.9 fL (ref 78.0–100.0)
Platelets: 290 10*3/uL (ref 150–400)
RBC: 3.27 MIL/uL — ABNORMAL LOW (ref 4.22–5.81)
RDW: 20 % — ABNORMAL HIGH (ref 11.5–15.5)
WBC: 6 10*3/uL (ref 4.0–10.5)

## 2012-05-16 LAB — RENAL FUNCTION PANEL
Albumin: 2.1 g/dL — ABNORMAL LOW (ref 3.5–5.2)
CO2: 29 mEq/L (ref 19–32)
Chloride: 93 mEq/L — ABNORMAL LOW (ref 96–112)
GFR calc Af Amer: 19 mL/min — ABNORMAL LOW (ref >90)
GFR calc non Af Amer: 17 mL/min — ABNORMAL LOW (ref >90)
Potassium: 3.1 mEq/L — ABNORMAL LOW (ref 3.5–5.1)
Sodium: 134 mEq/L — ABNORMAL LOW (ref 135–145)

## 2012-05-16 LAB — TOBRAMYCIN LEVEL, TROUGH: Tobramycin Tr: 3.3 ug/mL (ref 0.5–2.0)

## 2012-05-16 LAB — VANCOMYCIN, TROUGH: Vancomycin Tr: 15.1 ug/mL (ref 10.0–20.0)

## 2012-05-17 LAB — CULTURE, RESPIRATORY W GRAM STAIN

## 2012-05-18 ENCOUNTER — Other Ambulatory Visit (HOSPITAL_COMMUNITY): Payer: Medicare Other

## 2012-05-19 ENCOUNTER — Other Ambulatory Visit (HOSPITAL_COMMUNITY): Payer: Medicare Other

## 2012-05-19 LAB — MAGNESIUM: Magnesium: 2 mg/dL (ref 1.5–2.5)

## 2012-05-19 LAB — RENAL FUNCTION PANEL
Albumin: 2.2 g/dL — ABNORMAL LOW (ref 3.5–5.2)
BUN: 90 mg/dL — ABNORMAL HIGH (ref 6–23)
Chloride: 90 mEq/L — ABNORMAL LOW (ref 96–112)
GFR calc non Af Amer: 11 mL/min — ABNORMAL LOW (ref >90)
Phosphorus: 4.7 mg/dL — ABNORMAL HIGH (ref 2.3–4.6)
Potassium: 2.8 mEq/L — ABNORMAL LOW (ref 3.5–5.1)
Sodium: 131 mEq/L — ABNORMAL LOW (ref 135–145)

## 2012-05-19 LAB — CBC
MCV: 84 fL (ref 78.0–100.0)
Platelets: 363 10*3/uL (ref 150–400)
RBC: 3.37 MIL/uL — ABNORMAL LOW (ref 4.22–5.81)
RDW: 18.9 % — ABNORMAL HIGH (ref 11.5–15.5)
WBC: 6.8 10*3/uL (ref 4.0–10.5)

## 2012-05-21 LAB — RENAL FUNCTION PANEL
Albumin: 2.2 g/dL — ABNORMAL LOW (ref 3.5–5.2)
BUN: 89 mg/dL — ABNORMAL HIGH (ref 6–23)
Calcium: 9.1 mg/dL (ref 8.4–10.5)
Glucose, Bld: 112 mg/dL — ABNORMAL HIGH (ref 70–99)
Phosphorus: 4.9 mg/dL — ABNORMAL HIGH (ref 2.3–4.6)
Sodium: 138 mEq/L (ref 135–145)

## 2012-05-21 LAB — CBC
HCT: 28.2 % — ABNORMAL LOW (ref 39.0–52.0)
MCHC: 30.9 g/dL (ref 30.0–36.0)
MCV: 84.2 fL (ref 78.0–100.0)
RDW: 18.9 % — ABNORMAL HIGH (ref 11.5–15.5)

## 2012-05-23 LAB — RENAL FUNCTION PANEL
Albumin: 2.3 g/dL — ABNORMAL LOW (ref 3.5–5.2)
BUN: 87 mg/dL — ABNORMAL HIGH (ref 6–23)
Calcium: 9.1 mg/dL (ref 8.4–10.5)
Creatinine, Ser: 4.86 mg/dL — ABNORMAL HIGH (ref 0.50–1.35)
Phosphorus: 5.1 mg/dL — ABNORMAL HIGH (ref 2.3–4.6)

## 2012-05-23 LAB — CBC
HCT: 27.9 % — ABNORMAL LOW (ref 39.0–52.0)
MCH: 26.4 pg (ref 26.0–34.0)
MCHC: 31.2 g/dL (ref 30.0–36.0)
MCV: 84.5 fL (ref 78.0–100.0)
RDW: 18.5 % — ABNORMAL HIGH (ref 11.5–15.5)

## 2012-05-23 NOTE — Progress Notes (Signed)
Patient: Derrick [name] DOB: 31-Dec-1940 Date of Admission: 03/01/2012            Pulmonary consult  Date of Consult: 05/23/2012 MD requesting consult: [facility] Reason for consult:  ??decannulation   HPI -  71yo male with hx chronic renal failure, HTN, DM, CHF who was admitted from SNF to [facility] hospital on 6/24 with AMS.  He was found to have urinary retention and MRSA/Ecoli UTI with septic shock.  He was intubated, required pressors and dialysis.  He was initially unable to wean from vent and tracheostomy was placed.   Course was c/b acute on chronic renal failure, CDiff and acute on chronic diastolic dysfunction.  He has since been weaned from the vent and has tolerated ATC at all times since admission to [facility] hospital 7/5 for further abx and rehab.  He is improving overall and nearing discharge planning.  However his dialysis has become chronic and placement options are limited, so PCCM has been asked to eval for potential decannulation again.  We signed off 8/9 and stated no decannulation then  EVENTS  9/9 - Asked to evaluate patient for acute combined resp failure on 9/9, placed back on MV 9/16 - tolerating ATC, 3.5kgs removed during HD   CBC  Lab 05/23/12 0730 05/21/12 0630 05/19/12 0620  HGB 8.7* 8.7* 9.0*  HCT 27.9* 28.2* 28.3*  WBC 7.1 7.5 6.8  PLT 423* 442* 363    BMET  Lab 05/23/12 0730 05/21/12 0630 05/19/12 0620  NA 139 138 131*  K 3.9 3.9 --  CL 97 98 90*  CO2 28 26 28   GLUCOSE 120* 112* 111*  BUN 87* 89* 90*  CREATININE 4.86* 4.78* 4.82*  CALCIUM 9.1 9.1 9.0  MG -- -- 2.0  PHOS 5.1* 4.9* 4.7*      EXAM: General:  Chronically ill appearing male, NAD in bed  Neuro:  Drowsy, arousable, opens eyes but minimally interactive.  Grunts, occasionally nods head yes/no.  [name], gen weakness CV: s1s2 rrr PULM: resps even non labored on ATC with PMV.  #6 trach GI: abd soft, +bs Extremities:  Warm and dry, no edema   IMPRESSION/ PLAN:  Assessment: Chronic trach  dependent respiratory failure -trach initially placed r/t inability to wean from vent in setting UTI with septic shock, acute on chronic renal failure and now likely ESRD.   Pt remains severely deconditioned and mental status remains an issue at times. Acute decompensation likely due to volume status, ? Sedating meds (no indication that he received) that required patient back on the ventilator.  Now weaning again.  PLAN -  -Continue weaning per protocol. -Continue to diurese and keep as dry as possible. -Cont pt/ot . -Would not decannulate any time soon even if off the vent since patient's mental status is very poor.  Derrick [name], M.D. [facility]. Pager: [phone]. After hours pager: [phone].

## 2012-05-26 LAB — RENAL FUNCTION PANEL
CO2: 25 mEq/L (ref 19–32)
Calcium: 9.2 mg/dL (ref 8.4–10.5)
Creatinine, Ser: 6.08 mg/dL — ABNORMAL HIGH (ref 0.50–1.35)
GFR calc non Af Amer: 8 mL/min — ABNORMAL LOW (ref >90)

## 2012-05-26 LAB — CBC
MCH: 26.1 pg (ref 26.0–34.0)
MCV: 83.4 fL (ref 78.0–100.0)
Platelets: 461 10*3/uL — ABNORMAL HIGH (ref 150–400)
RBC: 3.37 MIL/uL — ABNORMAL LOW (ref 4.22–5.81)
RDW: 18.2 % — ABNORMAL HIGH (ref 11.5–15.5)

## 2012-05-26 NOTE — Progress Notes (Addendum)
Patient: Derrick Derrick [name] DOB: [birth year] Date of Admission: 03/01/2012            Pulmonary consult  Date of Consult: 05/26/2012 MD requesting consult: [facility] Reason for consult:  ??decannulation   HPI -  71yo male with hx chronic renal failure, HTN, DM, CHF who was admitted from SNF to [facility] hospital on 6/24 with AMS.  He was found to have urinary retention and MRSA/Ecoli UTI with septic shock.  He was intubated, required pressors and dialysis.  He was initially unable to wean from vent and tracheostomy was placed.   Course was c/b acute on chronic renal failure, CDiff and acute on chronic diastolic dysfunction.  He has since been weaned from the vent and has tolerated ATC at all times since admission to [facility] for further abx and rehab.  He is improving overall and nearing discharge planning.  However his dialysis has become chronic and placement options are limited, so PCCM has been asked to eval for potential decannulation again.  We signed off 8/9 and stated no decannulation then  EVENTS  9/9 - Asked to evaluate patient for acute combined resp failure on 9/9, placed back on MV 9/16 - tolerating ATC, 3.5kgs removed during HD   CBC  Lab 05/26/12 0606 05/23/12 0730 05/21/12 0630  HGB 8.8* 8.7* 8.7*  HCT 28.1* 27.9* 28.2*  WBC 8.6 7.1 7.5  PLT 461* 423* 442*    BMET  Lab 05/26/12 0606 05/23/12 0730 05/21/12 0630  NA 136 139 138  K 4.1 3.9 --  CL 93* 97 98  CO2 25 28 26   GLUCOSE 121* 120* 112*  BUN 121* 87* 89*  CREATININE 6.08* 4.86* 4.78*  CALCIUM 9.2 9.1 9.1  MG -- -- --  PHOS 6.7* 5.1* 4.9*      EXAM: General:  Chronically ill appearing male, NAD in bed  Neuro:  Drowsy, arousable, opens eyes but minimally interactive.  occasionally nods head yes/no.  Derrick [name], gen weakness CV: s1s2 rrr PULM: resps even non labored on ATC with PMV.  #6 trach GI: abd soft, +bs Extremities:  Warm and dry, no edema   IMPRESSION/ PLAN:  Assessment: Chronic trach dependent  respiratory failure -trach initially placed r/t inability to wean from vent in setting UTI with septic shock, acute on chronic renal failure and now likely ESRD.   Pt remains severely deconditioned and mental status remains an issue at times. Acute decompensation likely due to volume status, ? Sedating meds (no indication that he received) that required patient back on the ventilator.  Now on aerosol trach collar  PLAN -  -Continue to diurese and keep as dry as possible. -Cont pt/ot . -Would not decannulate any time soon even if off the vent since patient's mental status is very poor.  We will see him weekly    Agree with above   Billy Fischer, MD ; [facility] [phone].  After 5:30 PM or weekends, call [phone]

## 2012-05-28 LAB — RENAL FUNCTION PANEL
Albumin: 2.3 g/dL — ABNORMAL LOW (ref 3.5–5.2)
Creatinine, Ser: 5.5 mg/dL — ABNORMAL HIGH (ref 0.50–1.35)
GFR calc non Af Amer: 9 mL/min — ABNORMAL LOW (ref >90)
Glucose, Bld: 119 mg/dL — ABNORMAL HIGH (ref 70–99)
Sodium: 136 mEq/L (ref 135–145)

## 2012-05-28 LAB — CBC
Hemoglobin: 9.7 g/dL — ABNORMAL LOW (ref 13.0–17.0)
MCH: 26.1 pg (ref 26.0–34.0)
MCHC: 31.6 g/dL (ref 30.0–36.0)

## 2012-05-28 LAB — HEPATITIS B SURFACE ANTIGEN: Hepatitis B Surface Ag: NEGATIVE

## 2012-05-29 ENCOUNTER — Other Ambulatory Visit (HOSPITAL_COMMUNITY): Payer: Medicare Other

## 2012-05-30 LAB — RENAL FUNCTION PANEL
CO2: 27 mEq/L (ref 19–32)
Calcium: 9.8 mg/dL (ref 8.4–10.5)
Chloride: 94 mEq/L — ABNORMAL LOW (ref 96–112)
GFR calc Af Amer: 12 mL/min — ABNORMAL LOW (ref >90)
GFR calc non Af Amer: 10 mL/min — ABNORMAL LOW (ref >90)
Glucose, Bld: 130 mg/dL — ABNORMAL HIGH (ref 70–99)
Potassium: 3.8 mEq/L (ref 3.5–5.1)
Sodium: 136 mEq/L (ref 135–145)

## 2012-05-30 LAB — CBC
Hemoglobin: 10.2 g/dL — ABNORMAL LOW (ref 13.0–17.0)
MCH: 26.2 pg (ref 26.0–34.0)
Platelets: 457 10*3/uL — ABNORMAL HIGH (ref 150–400)
RBC: 3.9 MIL/uL — ABNORMAL LOW (ref 4.22–5.81)
WBC: 9.2 10*3/uL (ref 4.0–10.5)

## 2012-06-02 LAB — CBC
MCH: 26.1 pg (ref 26.0–34.0)
MCV: 81 fL (ref 78.0–100.0)
Platelets: 430 10*3/uL — ABNORMAL HIGH (ref 150–400)
RDW: 18.1 % — ABNORMAL HIGH (ref 11.5–15.5)
WBC: 13.3 10*3/uL — ABNORMAL HIGH (ref 4.0–10.5)

## 2012-06-02 LAB — RENAL FUNCTION PANEL
Albumin: 2.3 g/dL — ABNORMAL LOW (ref 3.5–5.2)
BUN: 121 mg/dL — ABNORMAL HIGH (ref 6–23)
Creatinine, Ser: 6.06 mg/dL — ABNORMAL HIGH (ref 0.50–1.35)
Phosphorus: 4.6 mg/dL (ref 2.3–4.6)
Potassium: 4.4 mEq/L (ref 3.5–5.1)

## 2012-06-02 NOTE — Progress Notes (Signed)
Patient: Derrick [name] DOB: 1941-02-24 Date of Admission: 03/01/2012            Pulmonary consult, Progress Note  Date of Consult: 06/02/2012 MD requesting consult: [facility] Reason for consult:  ??decannulation   HPI -  71yo male with hx chronic renal failure, HTN, DM, CHF who was admitted from SNF to [facility] hospital on 6/24 with AMS.  He was found to have urinary retention and MRSA/Ecoli UTI with septic shock.  He was intubated, required pressors and dialysis.  He was initially unable to wean from vent and tracheostomy was placed.   Course was c/b acute on chronic renal failure, CDiff and acute on chronic diastolic dysfunction.  He has since been weaned from the vent and has tolerated ATC at all times since admission to [facility] 7[facility] for further abx and rehab.  He is improving overall and nearing discharge planning.  However his dialysis has become chronic and placement options are limited, so PCCM has been asked to eval for potential decannulation again.  We signed off 8/9 and stated no decannulation then  EVENTS  9/9 - Asked to evaluate patient for acute combined resp failure on 9/9, placed back on MV 9/16 - tolerating ATC, 3.5kgs removed during HD  Subjective: - RT reports that pt has been TC during day, back to mandatory MV at night - Needs trach change to cuffed 6, cuff balloon blown - copious secretions, especially day before an HD treatment  CBC  Lab 06/02/12 0610 05/30/12 0625 05/28/12 0646  HGB 9.2* 10.2* 9.7*  HCT 28.6* 32.5* 30.7*  WBC 13.3* 9.2 8.2  PLT 430* 457* 471*   BMET  Lab 06/02/12 0610 05/30/12 0625 05/28/12 0646  NA 128* 136 136  K 4.4 3.8 --  CL 83* 94* 94*  CO2 26 27 26   GLUCOSE 219* 130* 119*  BUN 121* 93* 114*  CREATININE 6.06* 5.20* 5.50*  CALCIUM 9.5 9.8 9.4  MG -- -- --  PHOS 4.6 5.0* 6.1*    EXAM: General:  Chronically ill appearing male, NAD in bed  Neuro:  Drowsy, arousable, opens eyes but minimally interactive.  occasionally nods  head yes/no.  [name], gen weakness CV: s1s2 rrr PULM: resps even non labored on ATC with PMV.  #6 trach GI: abd soft, +bs Extremities:  Warm and dry, no edema   IMPRESSION/ PLAN:  Assessment: Chronic trach dependent respiratory failure -trach initially placed r/t inability to wean from vent in setting UTI with septic shock, acute on chronic renal failure and now ESRD on HD.   Pt remains severely deconditioned and mental status remains an issue at times. Has required nocturnal MV, doing ATC during the day  PLAN -  -Continue to diurese and keep as dry as possible. -Cont pt/ot . -Not a decannulation candidate now due to nocturnal vent needs >> will try to start weaning hours on vent back down, goal back to ATC 24x7 -Would not decannulate any time soon even if off the vent since patient's mental status is very poor.   Levy Pupa, MD, PhD 06/02/2012, 11:25 AM  Pulmonary and Critical Care [phone] or if no answer [phone]

## 2012-06-03 LAB — URINE MICROSCOPIC-ADD ON

## 2012-06-03 LAB — CBC
HCT: 29.3 % — ABNORMAL LOW (ref 39.0–52.0)
Hemoglobin: 9.3 g/dL — ABNORMAL LOW (ref 13.0–17.0)
MCH: 26.2 pg (ref 26.0–34.0)
MCHC: 31.7 g/dL (ref 30.0–36.0)

## 2012-06-03 LAB — URINALYSIS, ROUTINE W REFLEX MICROSCOPIC
Glucose, UA: 100 mg/dL — AB
Ketones, ur: NEGATIVE mg/dL
Protein, ur: >300 mg/dL — AB

## 2012-06-04 LAB — CULTURE, RESPIRATORY W GRAM STAIN

## 2012-06-04 LAB — RENAL FUNCTION PANEL
Albumin: 2.4 g/dL — ABNORMAL LOW (ref 3.5–5.2)
BUN: 115 mg/dL — ABNORMAL HIGH (ref 6–23)
CO2: 27 mEq/L (ref 19–32)
Chloride: 89 mEq/L — ABNORMAL LOW (ref 96–112)
Creatinine, Ser: 5.55 mg/dL — ABNORMAL HIGH (ref 0.50–1.35)
Glucose, Bld: 195 mg/dL — ABNORMAL HIGH (ref 70–99)

## 2012-06-04 LAB — CBC
HCT: 29.9 % — ABNORMAL LOW (ref 39.0–52.0)
Hemoglobin: 9.5 g/dL — ABNORMAL LOW (ref 13.0–17.0)
MCV: 82.8 fL (ref 78.0–100.0)
RDW: 18.4 % — ABNORMAL HIGH (ref 11.5–15.5)
WBC: 10.6 10*3/uL — ABNORMAL HIGH (ref 4.0–10.5)

## 2012-06-04 LAB — CULTURE, BLOOD (ROUTINE X 2): Culture: NO GROWTH

## 2012-06-04 LAB — URINE CULTURE
Colony Count: NO GROWTH
Culture: NO GROWTH

## 2012-06-06 LAB — CBC WITH DIFFERENTIAL/PLATELET
Eosinophils Absolute: 0 10*3/uL (ref 0.0–0.7)
Eosinophils Relative: 0 % (ref 0–5)
HCT: 29.5 % — ABNORMAL LOW (ref 39.0–52.0)
Hemoglobin: 9.1 g/dL — ABNORMAL LOW (ref 13.0–17.0)
Lymphocytes Relative: 13 % (ref 12–46)
Lymphs Abs: 2 10*3/uL (ref 0.7–4.0)
MCH: 26 pg (ref 26.0–34.0)
MCV: 84.3 fL (ref 78.0–100.0)
Monocytes Absolute: 2 10*3/uL — ABNORMAL HIGH (ref 0.1–1.0)
Monocytes Relative: 14 % — ABNORMAL HIGH (ref 3–12)
RBC: 3.5 MIL/uL — ABNORMAL LOW (ref 4.22–5.81)

## 2012-06-06 LAB — RENAL FUNCTION PANEL
BUN: 107 mg/dL — ABNORMAL HIGH (ref 6–23)
CO2: 23 mEq/L (ref 19–32)
Calcium: 9.7 mg/dL (ref 8.4–10.5)
Glucose, Bld: 196 mg/dL — ABNORMAL HIGH (ref 70–99)
Phosphorus: 3.4 mg/dL (ref 2.3–4.6)

## 2012-06-07 LAB — CBC
MCHC: 30.9 g/dL (ref 30.0–36.0)
RDW: 18.8 % — ABNORMAL HIGH (ref 11.5–15.5)

## 2012-06-08 LAB — CBC
Platelets: 384 10*3/uL (ref 150–400)
RBC: 3.33 MIL/uL — ABNORMAL LOW (ref 4.22–5.81)
WBC: 12.4 10*3/uL — ABNORMAL HIGH (ref 4.0–10.5)

## 2012-06-08 LAB — CULTURE, BLOOD (ROUTINE X 2)

## 2012-06-09 LAB — RENAL FUNCTION PANEL
CO2: 25 mEq/L (ref 19–32)
Chloride: 94 mEq/L — ABNORMAL LOW (ref 96–112)
GFR calc Af Amer: 9 mL/min — ABNORMAL LOW (ref >90)
GFR calc non Af Amer: 7 mL/min — ABNORMAL LOW (ref >90)
Sodium: 139 mEq/L (ref 135–145)

## 2012-06-09 LAB — CBC
Hemoglobin: 8.2 g/dL — ABNORMAL LOW (ref 13.0–17.0)
MCHC: 30.8 g/dL (ref 30.0–36.0)
RBC: 3.22 MIL/uL — ABNORMAL LOW (ref 4.22–5.81)

## 2012-06-09 LAB — BUN: BUN: 54 mg/dL — ABNORMAL HIGH (ref 6–23)

## 2012-06-09 NOTE — Progress Notes (Signed)
Patient: Derrick [name] DOB: 04-25-1941 Date of Admission: 03/01/2012            Pulmonary consult, Progress Note  Date of Consult: 06/09/2012 MD requesting consult: [facility] Reason for consult:  ??decannulation   HPI -  71yo male with hx chronic renal failure, HTN, DM, CHF who was admitted from SNF to [facility] hospital on 6/24 with AMS.  He was found to have urinary retention and MRSA/Ecoli UTI with septic shock.  He was intubated, required pressors and dialysis.  He was initially unable to wean from vent and tracheostomy was placed.   Course was c/b acute on chronic renal failure, CDiff and acute on chronic diastolic dysfunction.  He has since been weaned from the vent and has tolerated ATC at all times since admission to [facility] 7/[facility] for further abx and rehab.  He is improving overall and nearing discharge planning.  However his dialysis has become chronic and placement options are limited, so PCCM has been asked to eval for potential decannulation again.  We signed off 8/9 and stated no decannulation then  EVENTS  9/9 - Asked to evaluate patient for acute combined resp failure on 9/9, placed back on MV 9/16 - tolerating ATC, 3.5kgs removed during HD  Subjective: -Tolerating ATC daytime.  Episode desat over weekend so rested on vent.  Now tol ATC again.   CBC  Lab 06/09/12 0528 06/08/12 0703 06/07/12 1151  HGB 8.2* 8.4* 8.4*  HCT 26.6* 27.9* 27.2*  WBC 9.8 12.4* 13.2*  PLT 348 384 355   BMET  Lab 06/09/12 0528 06/06/12 0550 06/04/12 0647  NA 139 135 134*  K 3.6 4.0 --  CL 94* 91* 89*  CO2 25 23 27   GLUCOSE 167* 196* 195*  BUN 144* 107* 115*  CREATININE 6.58* 5.32* 5.55*  CALCIUM 9.5 9.7 10.0  MG -- -- --  PHOS 4.6 3.4 4.6    EXAM: General:  Chronically ill appearing male, NAD in bed  Neuro:  Drowsy, arousable, opens eyes but minimally interactive,[name], gen weakness CV: s1s2 rrr PULM: resps even non labored on ATC with PMV.  #6 trach GI: abd soft, +bs Extremities:   Warm and dry, no edema   IMPRESSION/ PLAN:  Assessment: Chronic trach dependent respiratory failure -trach initially placed r/t inability to wean from vent in setting UTI with septic shock, acute on chronic renal failure and now ESRD on HD.   Pt remains severely deconditioned and mental status remains an issue at times. Has required nocturnal MV, doing ATC during the day Responding of recent to ABX vre coverage  PLAN -  -Continue to diurese and keep as dry as possible. -Cont pt/ot . -Not a decannulation candidate at this time due to nocturnal vent needs and mental status -Cont wean to goal 24/7 ATC Agree needs vent support for transportation to [facility] -intermittent f/u CXR  -abx per primary (currently on tobra) , establish stop date -if any declines while here still, obtain pcxr, none neoted for about 10 days  [facility], NP 06/09/2012  10:36 AM Pager: [phone] or [phone]  *Care during the described time interval was provided by me and/or other providers on the critical care team. I have reviewed this patient's available data, including medical history, events of note, physical examination and test results as part of my evaluation.  I have fully examined this patient and agree with above findings.    And edited infull  Mcarthur Rossetti. Tyson Alias, MD, FACP Pgr: [phone] [facility]

## 2012-06-10 ENCOUNTER — Other Ambulatory Visit (HOSPITAL_COMMUNITY): Payer: Medicare Other

## 2012-07-13 DEATH — deceased

## 2013-09-28 IMAGING — CT CT HEAD WITHOUT CONTRAST
4 series · 17 of 30 positions shown, 19 images · non-contrast
Comparison: none

REASON FOR EXAM: aloc, fevers
COMMENTS:

[Series 2: without · axial · non-contrast · 0.42mm/px · z∈[-140,-40]mm · 6 of 29 slices shown (1 of 2)]
[im 5/29  brain]
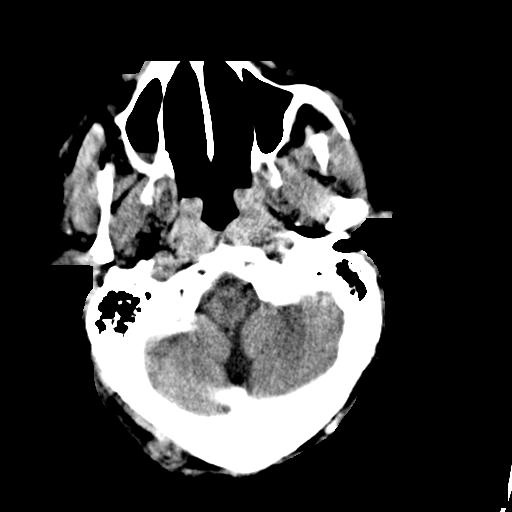
[im 9/29  brain]
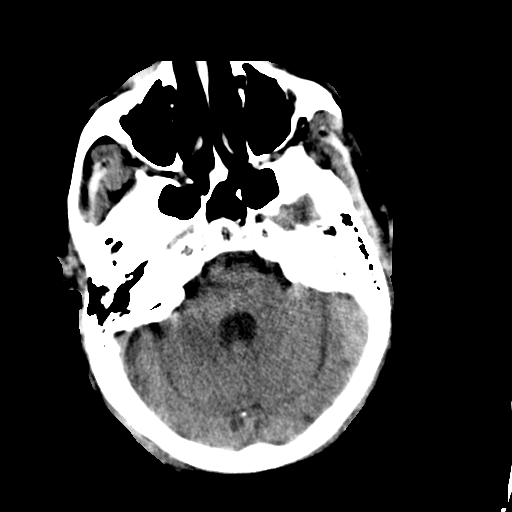
[im 13/29  brain]
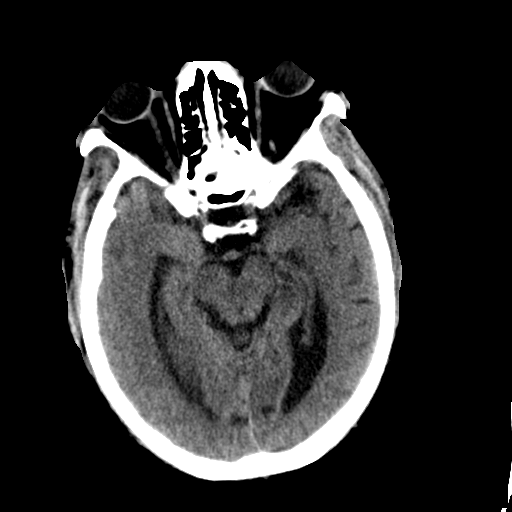
[im 17/29  brain]
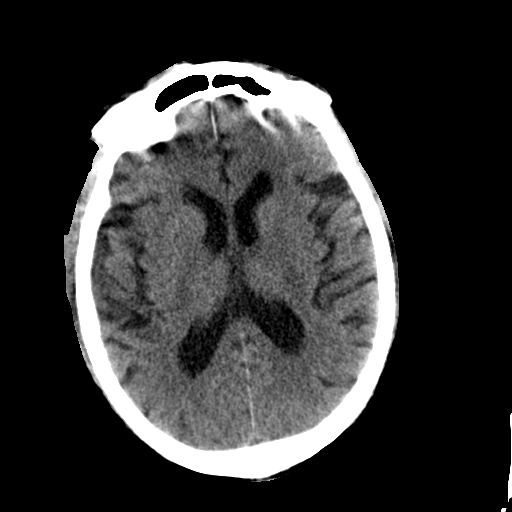
[im 21/29  brain]
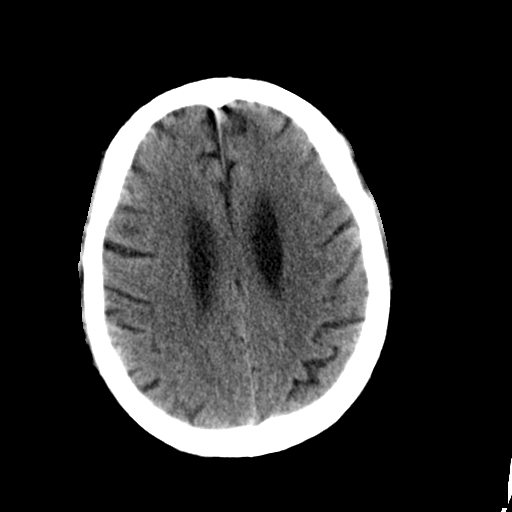
[im 25/29  brain]
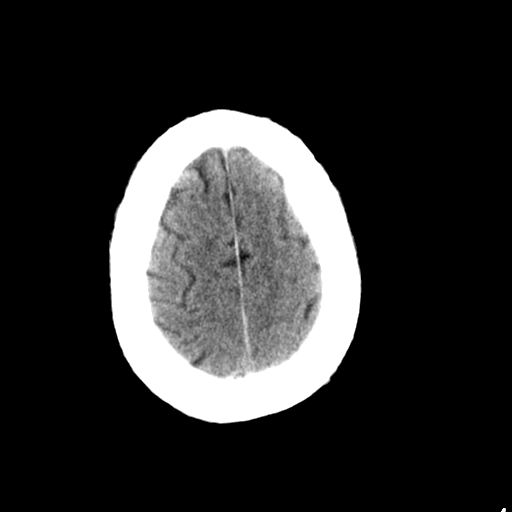

[Series 3: bone · axial · 0.42mm/px · 1 of 29 slices shown (1 of 2)]
[im 5/29  bone]
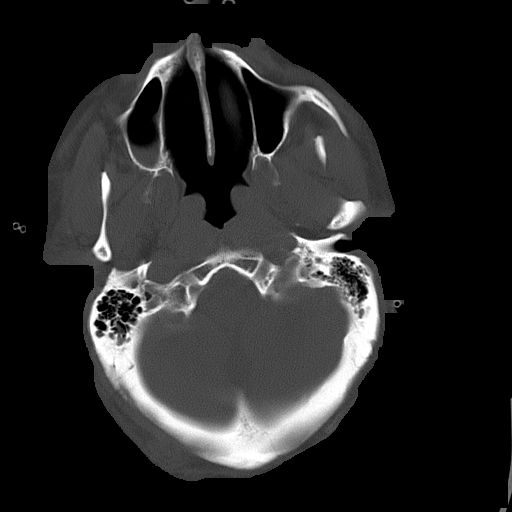

[Series 4: without · axial · non-contrast · 0.42mm/px · z∈[-135,-45]mm · 5 of 28 slices shown, 7 images (2 of 2)]
[im 5/28  brain]
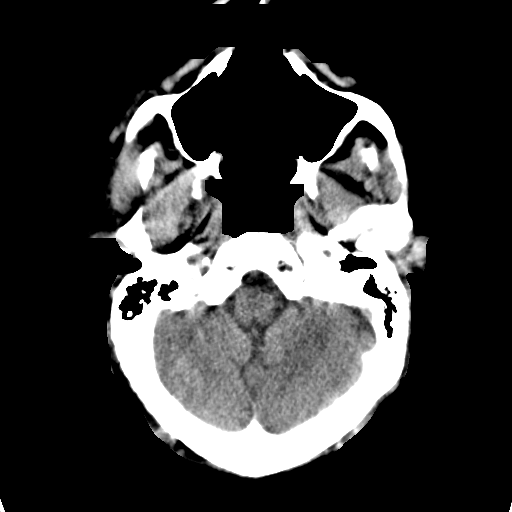
[im 5/28  bone]
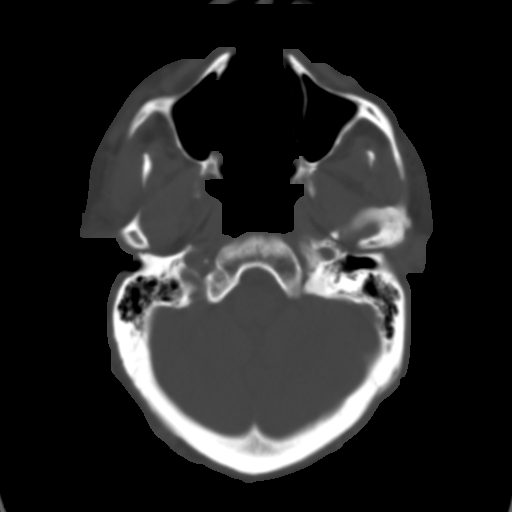
[im 10/28  brain]
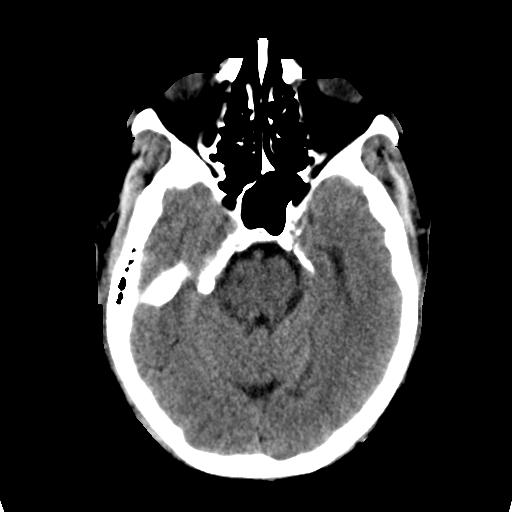
[im 14/28  brain]
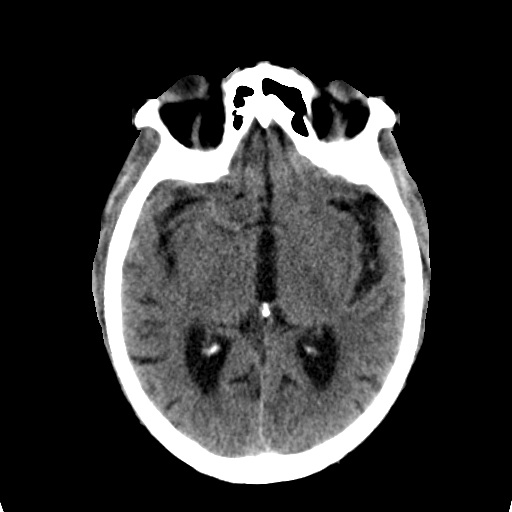
[im 19/28  brain]
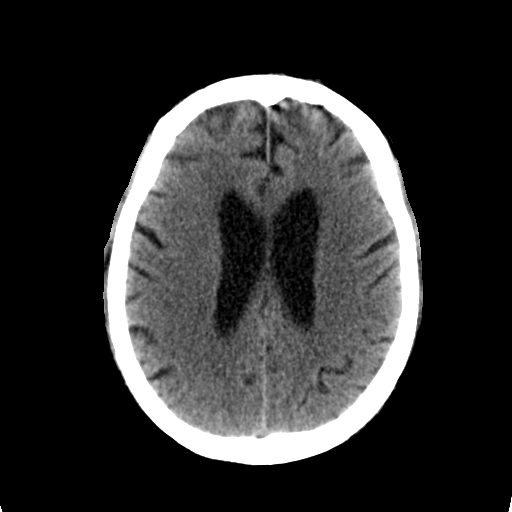
[im 23/28  brain]
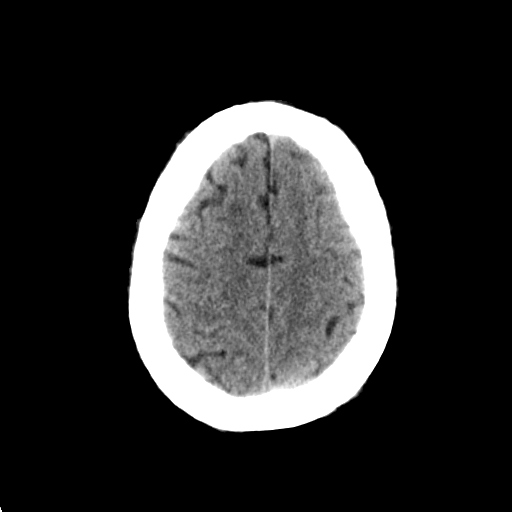
[im 23/28  bone]
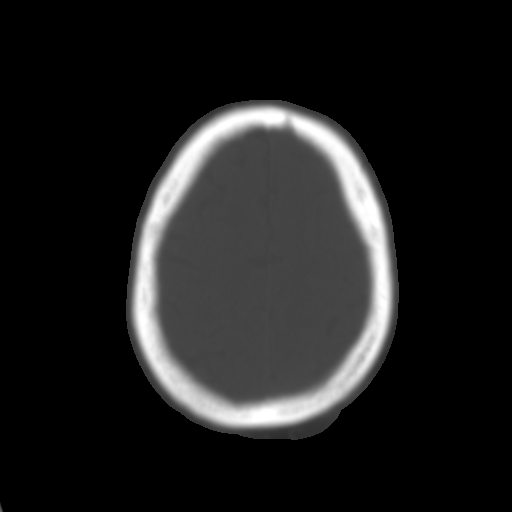

[Series 5: bone · axial · 0.42mm/px · z∈[-135,-45]mm · 5 of 28 slices shown (2 of 2)]
[im 5/28  bone]
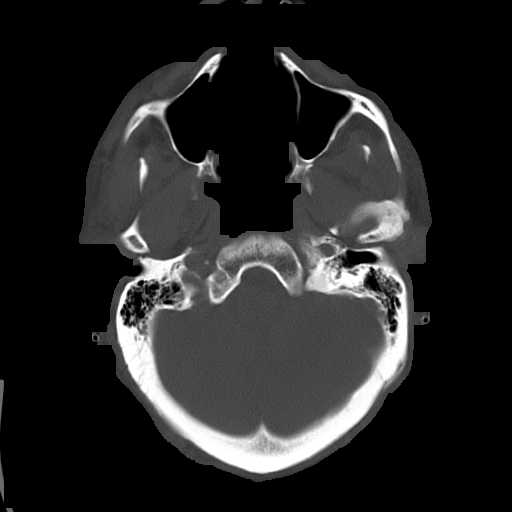
[im 10/28  bone]
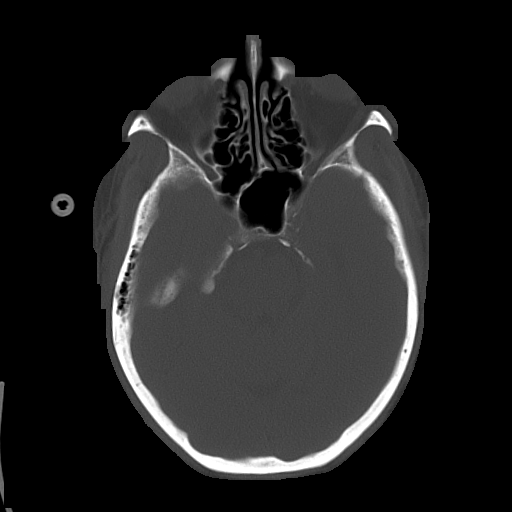
[im 14/28  bone]
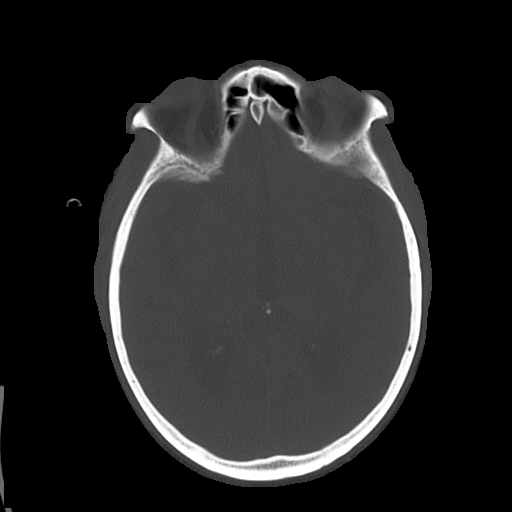
[im 19/28  bone]
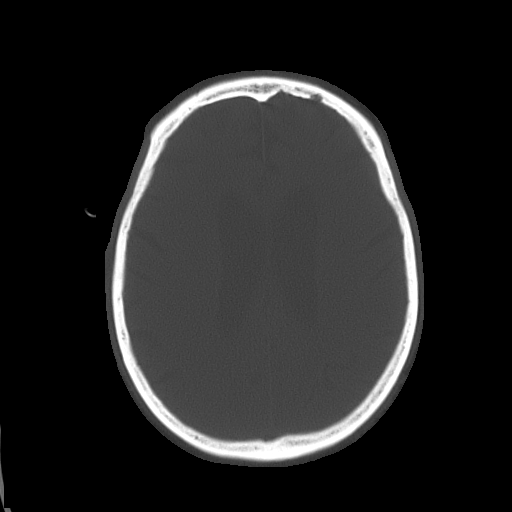
[im 23/28  bone]
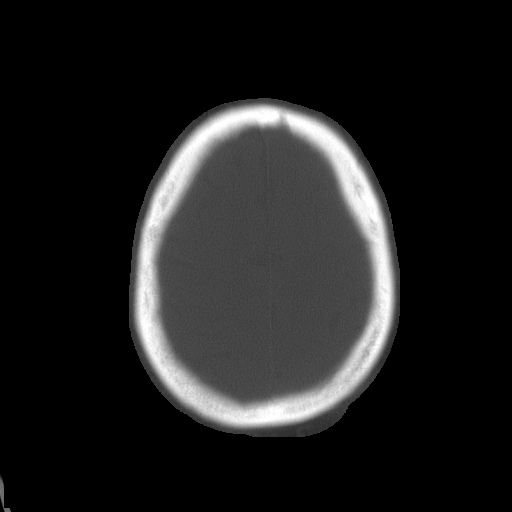

[17 of 30 positions shown; findings below may reference images not displayed]

PROCEDURE:     CT  - CT HEAD WITHOUT CONTRAST  - February 03, 2012 [DATE]

RESULT:     CT of the brain without contrast is compared to a previous
similar study dated 26 December, 2011.

There is mild prominence of the ventricles and sulci. Low-attenuation is
present within the periventricular and subcortical white matter regions.
There is no intracranial hemorrhage, mass, mass effect or midline shift. No
evolving infarct is appreciated. The included sinuses and mastoid air cells
show normal appearing aeration. The calvarium is intact.
IMPRESSION: 1. Changes of atrophy and chronic small vessel ischemic disease. No acute
intracranial abnormality evident.

[REDACTED]

## 2013-09-29 IMAGING — CR DG CHEST 1V PORT
1 series · 1 of 1 positions shown · non-contrast
Comparison: none

REASON FOR EXAM: CENTRAL LINE PLACEMENT
COMMENTS:

[portable]
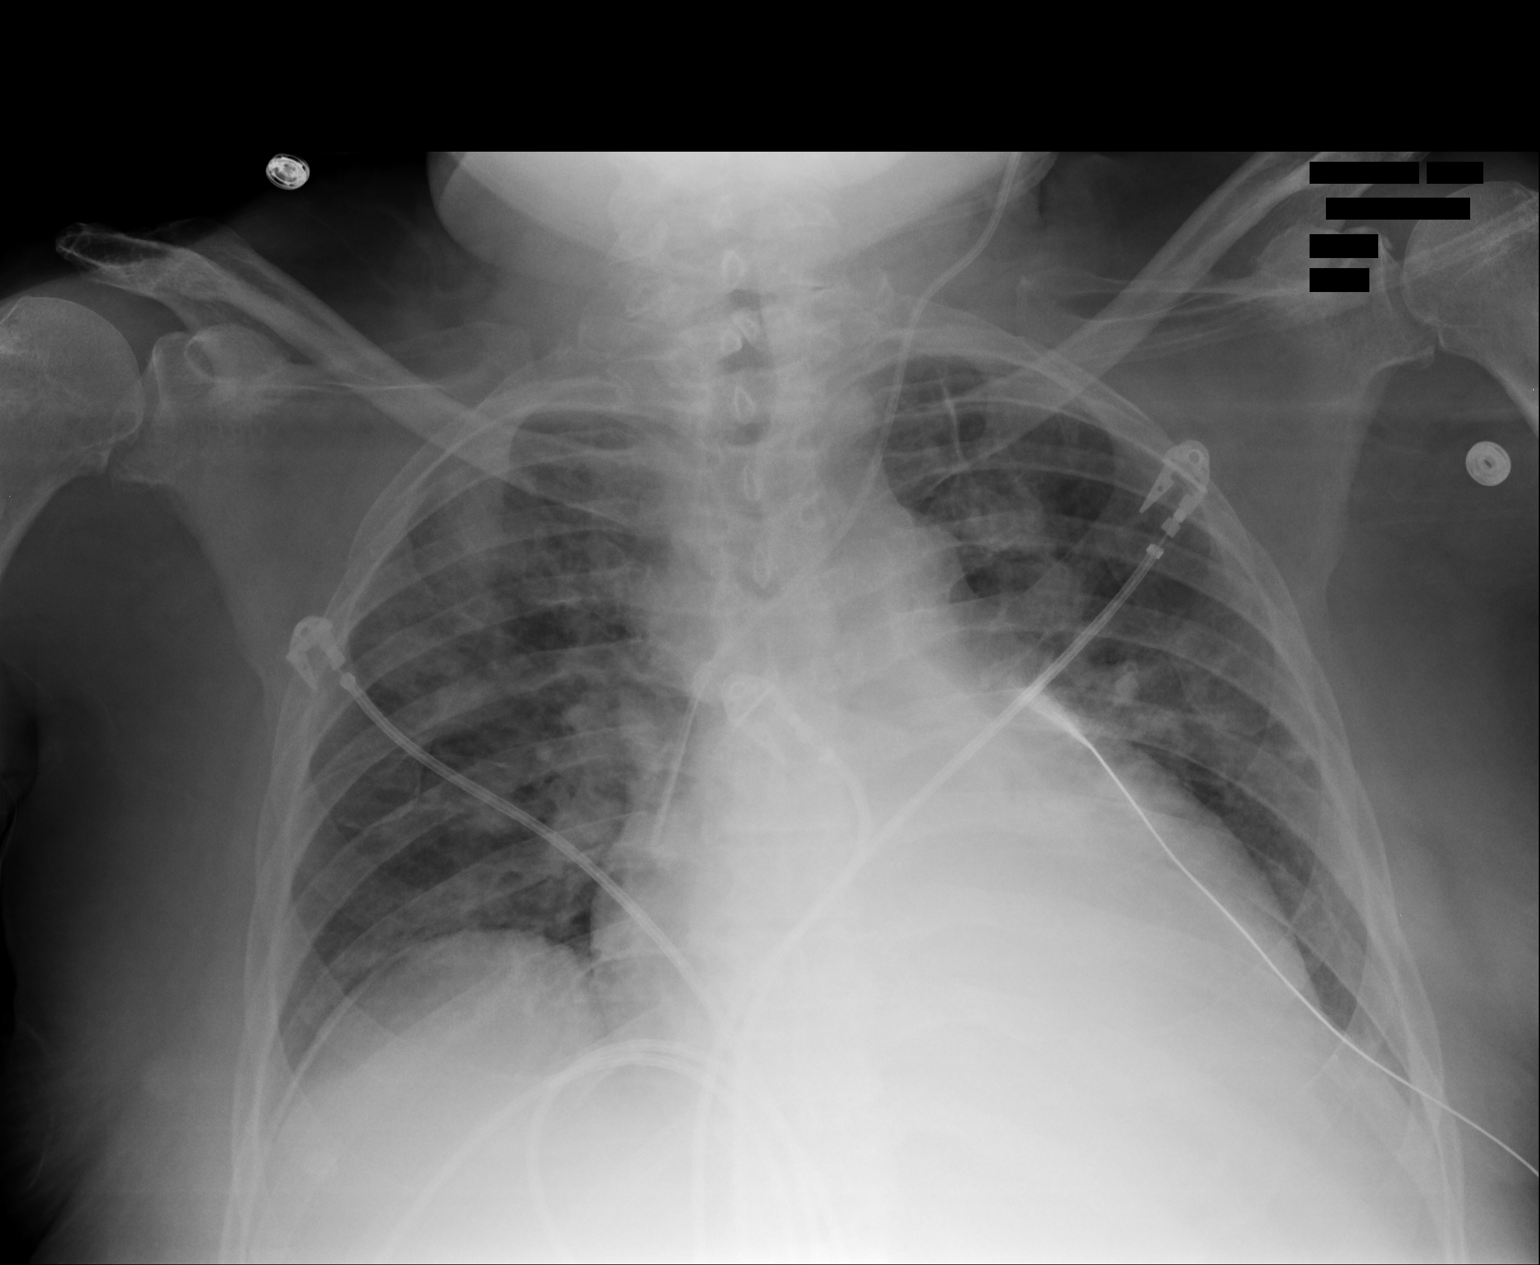

[1 of 1 positions shown; findings below may reference images not displayed]

PROCEDURE:     DXR - DXR PORTABLE CHEST SINGLE VIEW  - February 04, 2012  [DATE]

RESULT:     Cardiomegaly with pulmonary vascular prominence and interstitial
prominence consistent with congestive heart failure is noted. These findings
have improved from 02/03/2012. Central line noted in good anatomic position.
IMPRESSION: Improving changes of congestive heart failure and pulmonary
edema with residual pulmonary edema present. Central line noted in good
anatomic position.

## 2013-10-04 IMAGING — CR DG CHEST 1V PORT
1 series · 1 of 1 positions shown · non-contrast
Comparison: none

REASON FOR EXAM: resp failure
COMMENTS:

PROCEDURE:     DXR - DXR PORTABLE CHEST SINGLE VIEW  - February 09, 2012 [DATE]
RESULT:     Comparison: 02/06/2012

[portable]
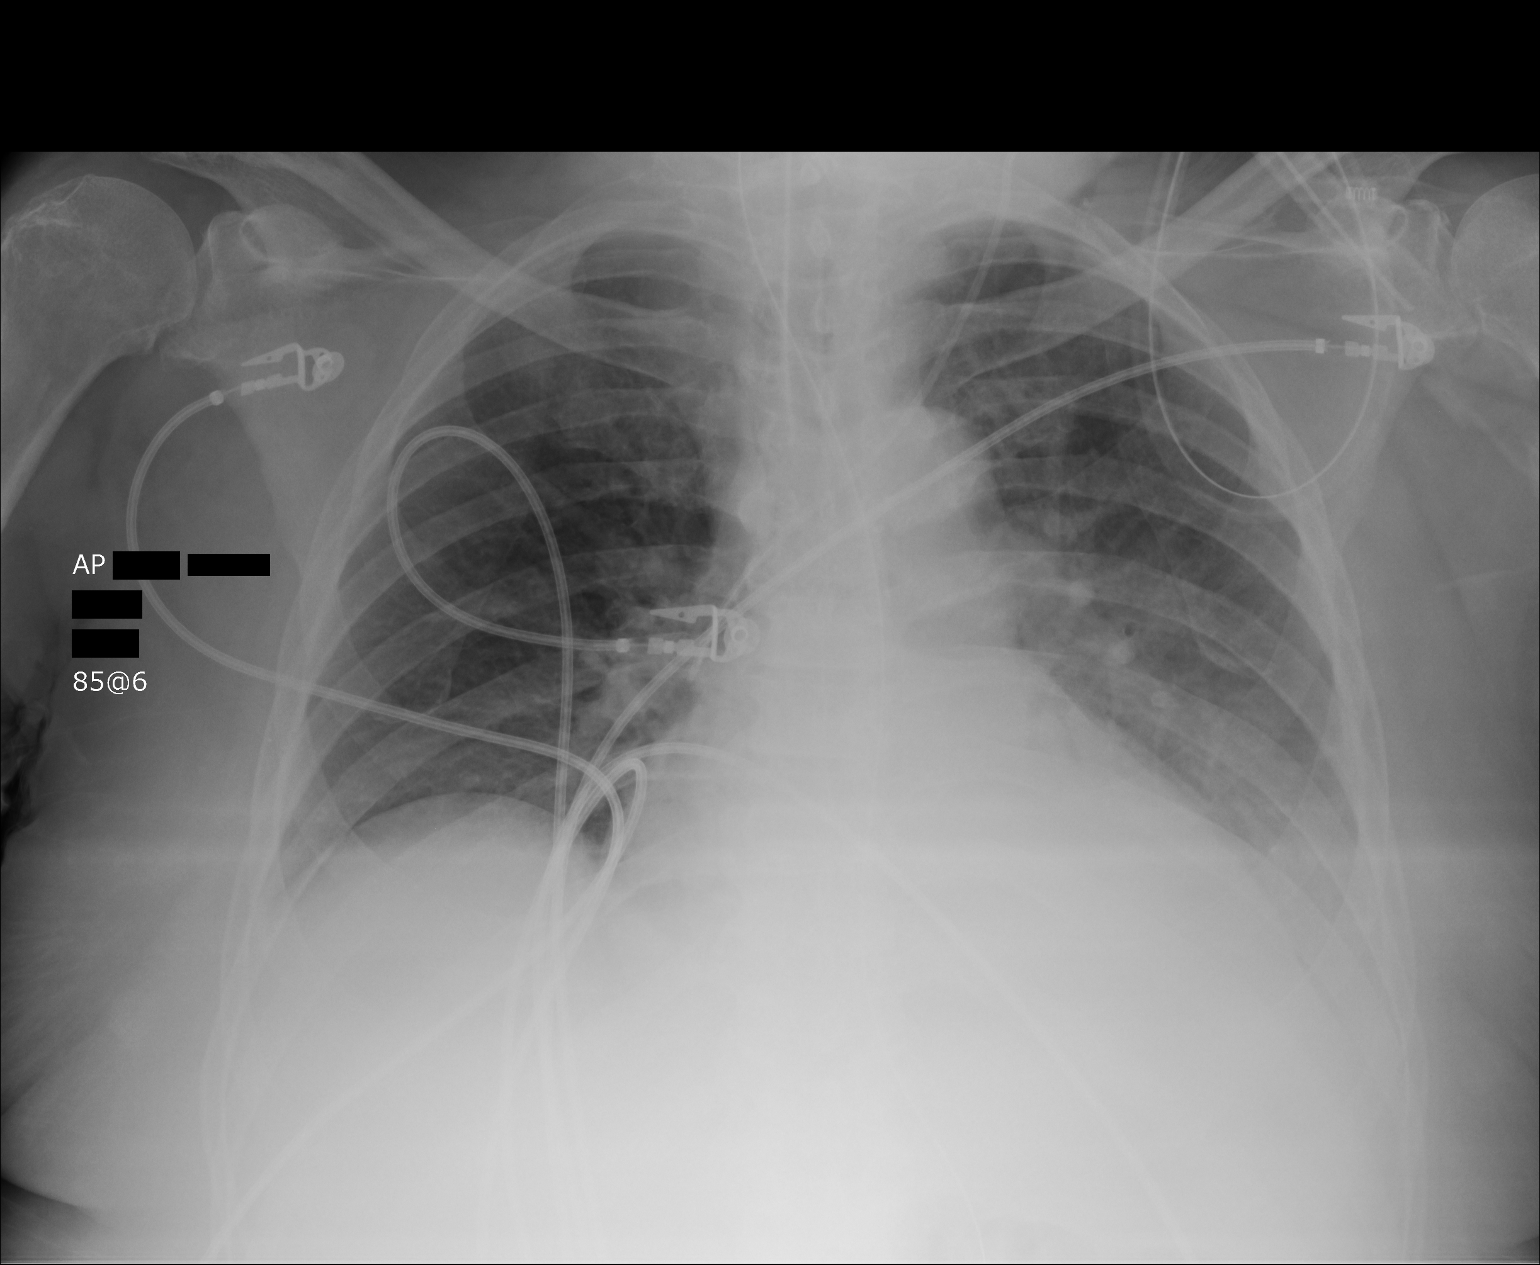

[1 of 1 positions shown; findings below may reference images not displayed]

FINDINGS: Support apparatus is stable. Heart and mediastinum are stable. The lung
volumes are low. There is mild to moderate opacity at the left lung base.
IMPRESSION: Opacity at the left lung base may represent a combination of small left
pleural effusion and atelectasis. Infection is not excluded.

[REDACTED]

## 2013-10-05 IMAGING — CR DG CHEST 1V PORT
1 series · 1 of 1 positions shown · non-contrast
Comparison: none

REASON FOR EXAM: resp failure
COMMENTS:

PROCEDURE:     DXR - DXR PORTABLE CHEST SINGLE VIEW  - February 10, 2012  [DATE]
RESULT:     Comparison: 02/09/2012

[ap]
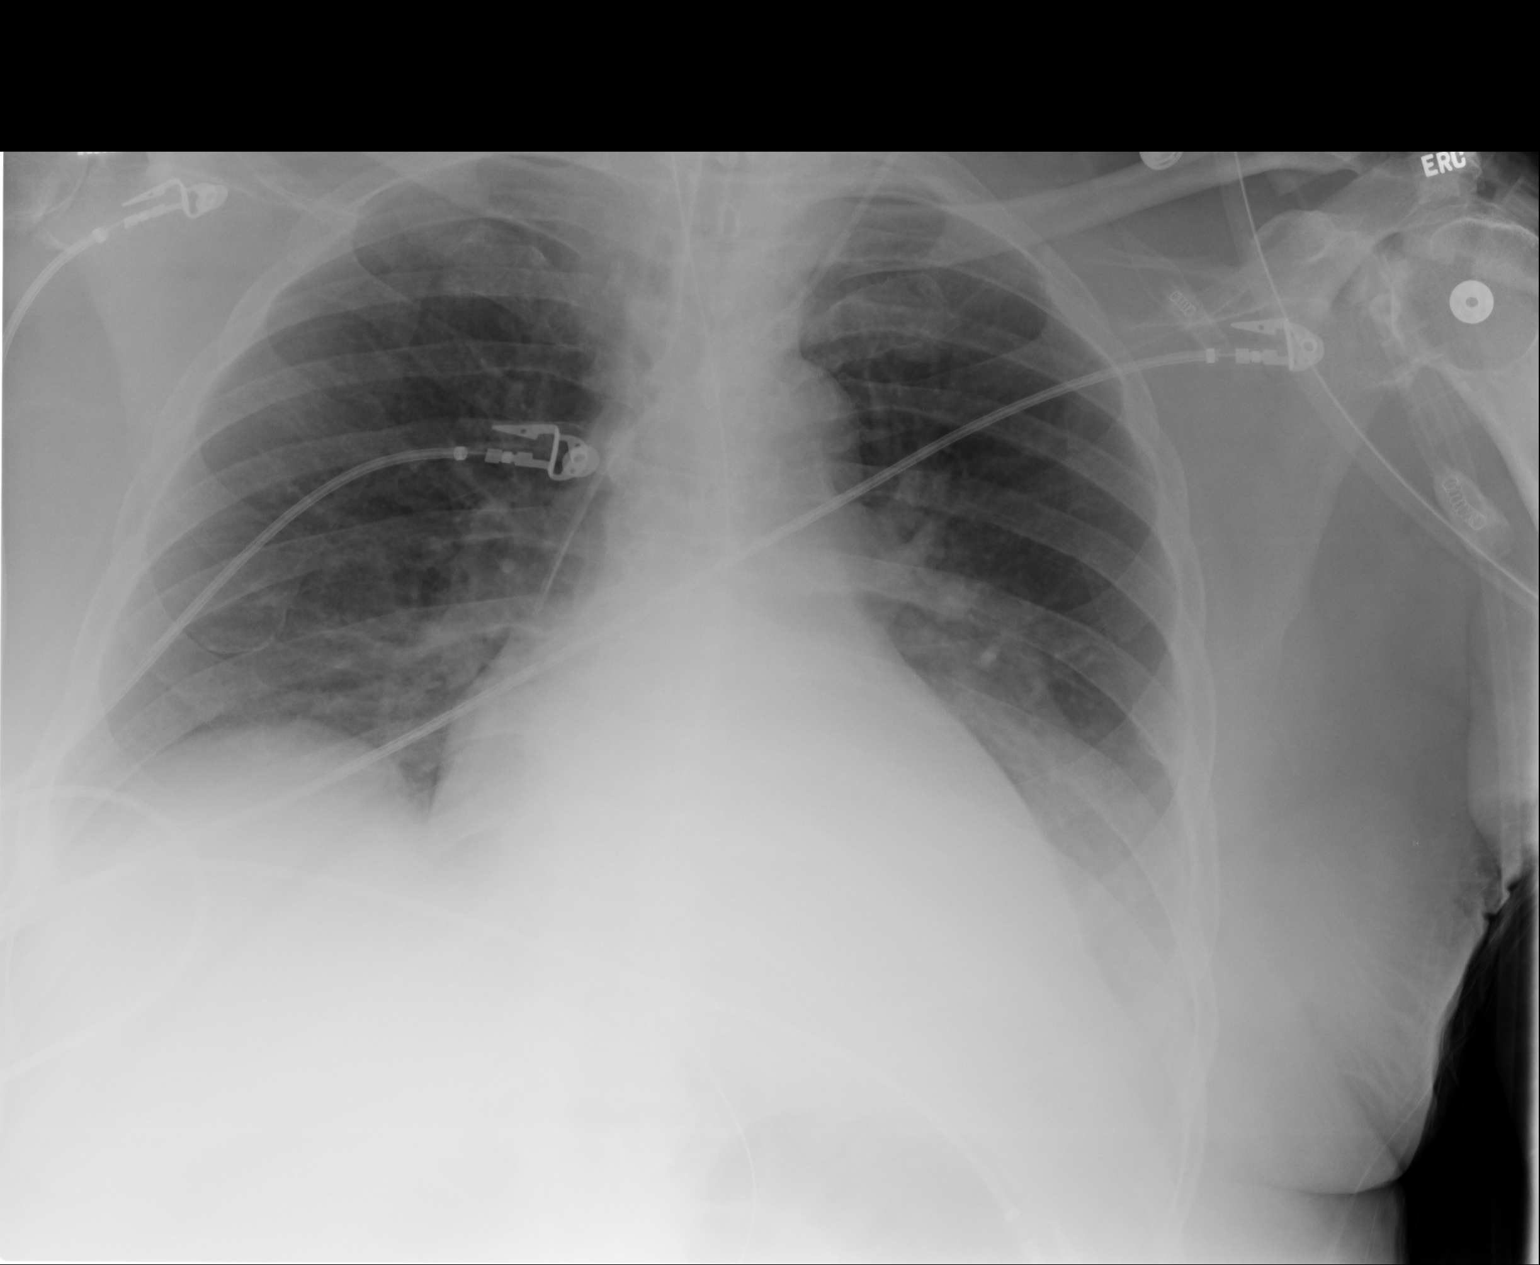

[1 of 1 positions shown; findings below may reference images not displayed]

FINDINGS: The heart and mediastinum are stable. Support apparatus is stable.
Retrocardiac and left lung base opacity are similar to prior, likely a
combination of atelectasis and small pleural effusion.
IMPRESSION: Left basilar opacity is similar to prior, likely a combination of
atelectasis and small pleural effusion. Infection is not excluded.

## 2013-10-13 IMAGING — CR DG CHEST 1V PORT
1 series · 1 of 1 positions shown · non-contrast
Comparison: none

REASON FOR EXAM: resp failure, cough
COMMENTS:

PROCEDURE:     DXR - DXR PORTABLE CHEST SINGLE VIEW  - February 18, 2012  [DATE]
RESULT:     Comparison: None

[ap]
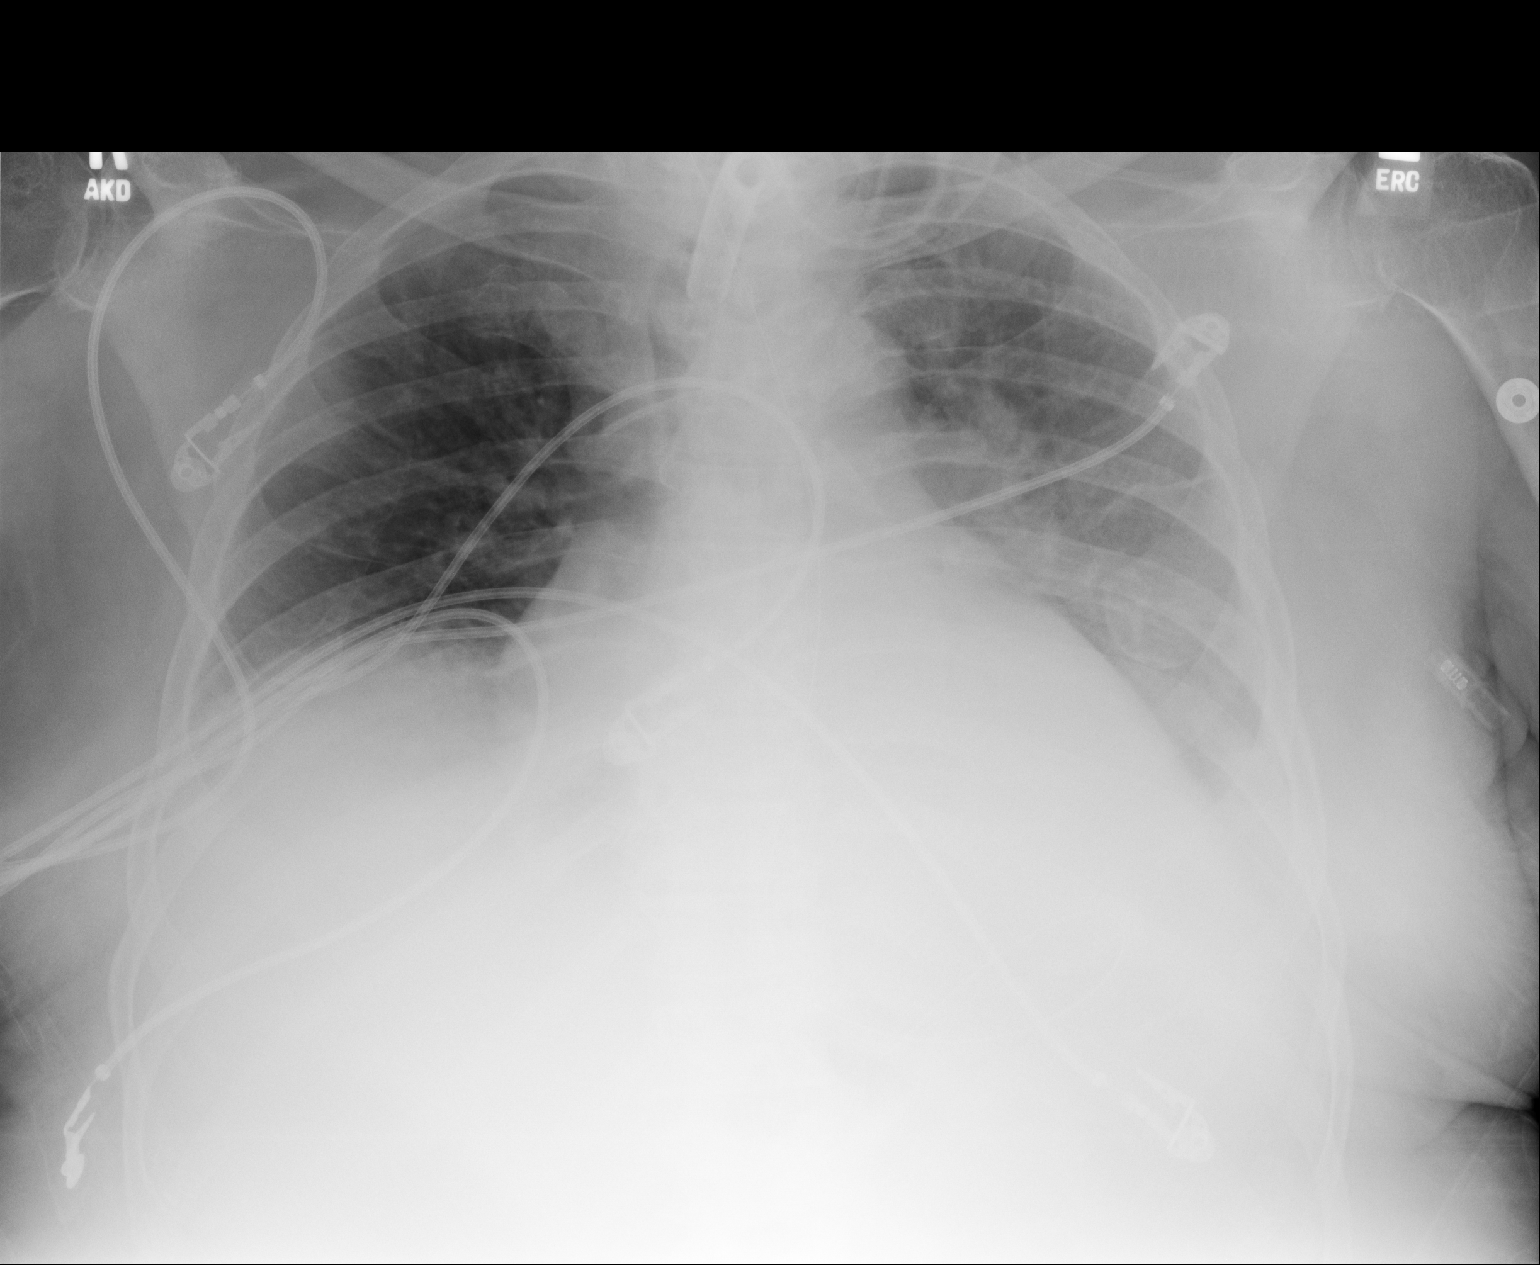

[1 of 1 positions shown; findings below may reference images not displayed]

FINDINGS: Single portable AP chest radiograph is provided. There is left diffuse
interstitial thickening likely representing interstitial edema versus
interstitial pneumonitis secondary to an infectious or inflammatory
etiology. There are lung volumes. There is a tracheostomy tube in
satisfactory position. There is a left-sided central venous catheter
projecting over the confluence of the brachycephalic vein and SVC. There is
no definite pleural effusion. The heart size is stable. The osseous
structures are unremarkable.
IMPRESSION: Please see above.

[REDACTED]

## 2014-11-30 NOTE — Consult Note (Signed)
PATIENT NAME:  ETHAN, CLAYBURN MR#:  [id number] DATE OF BIRTH:  02-Oct-1940  DATE OF CONSULTATION:  02/14/2012  REFERRING PHYSICIAN:   CONSULTING PHYSICIAN:  Sammuel Hines. Richardson Landry, MD  ADDENDUM   This was a Critical Care Unit consultation. Total time encompassing: 35 minutes. This included phone consultation with the family to obtain consent, including risk of bleeding, recurrent laryngeal nerve injury, infection, scarring, and possible long-term need for tracheostomy. His family agrees to proceed.      ____________________________ Sammuel Hines. Richardson Landry, MD psb:ap D: 02/25/2012 17:06:00 ET T: 02/25/2012 18:05:43 ET JOB#: [id number]  cc: Sammuel Hines. Richardson Landry, MD, <Dictator> Riley Nearing MD ELECTRONICALLY SIGNED 02/27/2012 8:00

## 2014-11-30 NOTE — Discharge Summary (Signed)
PATIENT NAME:  Derrick [name], Derrick [name] MR#:  [id number] DATE OF BIRTH:  01/09/1941  DATE OF ADMISSION:  02/04/2012 DATE OF DISCHARGE:  03/01/2012  DISCHARGE DIAGNOSES:  1. Sepsis secondary to urinary source.  2. Acute on chronic renal failure requiring hemodialysis.  3. Respiratory failure requiring intubation and tracheostomy.  4. History of extended-spectrum beta-lactamase producing Escherichia coli sepsis.  5. History of Clostridium difficile colitis.  6. History of methicillin-resistant Staphylococcus aureus.  7. History of atrial fibrillation.  8. Anemia.  DISCHARGE MEDICATIONS:  1. Acetaminophen 650 mg via NG tube every 4 to 6 hours p.r.n. for pain.  2. Atropine 1% ophthalmic drops one drop in the left eye twice a day. 3. Latanoprost 0.005% ophthalmic drops one drop to right eye at bedtime.  4. Morphine 2 mg IV every four hours p.r.n. for pain.  5. Chlorhexidine 0.12% liquid 5 mL p.o. every 12 hours.  6. Scopolamine one patch every 3 days, topical.  7. Sliding scale insulin coverage.  8. Flagyl 500 mg per NG tube every 8 hours x5 more days. 9. Ranitidine 150 mg per NG tube every 12 hours. 10. Hydralazine 25 mg p.o. every eight hours.  11. Metoprolol 50 mg p.o. twice a day. 12. Zinc oxide ointment apply to affected area every 8 hours. 13. Invanz 1 gram every 24 hours per IV. May stop this medication per physician discretion at the new facility.   PROCEDURES:  1. IJ placement for access, per Dr. Lucky Cowboy.  2. Tracheostomy on 02/15/2012 per Dr. Clyde Canterbury.  PERTINENT DISCHARGE LABORATORY DATA: Sodium 137, potassium 4.1, BUN 30, and creatinine 1.98. White blood cell count 7.6, hemoglobin 7.1, platelets 319, and MCV 85.   It should be noted that blood cultures were positive upon admission for MRSA and ESBL producing Escherichia coli. On 02/04/2012, it was C. difficile positive per PCR. Repeat cultures on 02/22/2012 were negative. Urine cultures on 07/13 and 02/24/2012 were also  negative.  HOSPITAL COURSE: This is a 74 year old African American male who was admitted initially from the nursing home with complaints of altered mental status and fever. When initially evaluated he was noted to become hypotensive with urinalysis significant for white blood cells and blood and it was thought that his sepsis was secondary to a urinary source. The cultures did grow out MRSA and ESBL producing Escherichia coli, initially. Repeat cultures were then negative. He was taken to the Intensive Care Unit where he was placed on pressors and IV fluids. He developed respiratory failure and was intubated on 02/05/2012. It was difficult for him to be extubated. He failed spontaneous breathing trials. It was then advised to pursue tracheostomy. The family agreed to this. He had a trach placed on 02/15/2012, per Dr. Clyde Canterbury. The patient also developed acute on chronic renal failure and was seen by nephrology and had to pursue hemodialysis because of worsening renal failure. He also developed C. difficile during his hospital stay that required treatment of Flagyl. He has had intermittent fevers throughout his hospital course, but his white blood cell count has remained stable and his blood pressure has remained stable over the past several days. It was recommended by Dr. Clayborn Bigness to continue the Flagyl for five more days, but discontinue the Invanz as no other source for infection was present. The patient is stable at this time and will need to be transferred to [facility] for further care of his tracheostomy. ____________________________ Dion Body, MD kl:slb D: 03/01/2012 09:03:42 ET T: 03/01/2012 09:35:04 ET JOB#:  [id number]  cc: Dion Body, MD, <Dictator> Ocie Cornfield. Ouida Sills, MD Dion Body MD ELECTRONICALLY SIGNED 03/13/2012 8:31

## 2014-12-05 NOTE — Consult Note (Signed)
NG removed. 20 Fr G tube placed in antrum. Antral bx taken for inflammation. Prophylactive Abx ordered but not given. Will order now. Can use G tube for meds today. Can resume lovenox tomorrow. Can start TF at low continuous rate in AM if patient stable. Advance rate slowly as tolerated. Thanks.  Electronic Signatures: Verdie Shire (MD)  (Signed on 10-Jul-13 16:33)  Authored  Last Updated: 10-Jul-13 16:33 by Verdie Shire (MD)

## 2014-12-05 NOTE — Consult Note (Signed)
PATIENT NAME:  Derrick [name], Derrick [name] MR#:  [id number] DATE OF BIRTH:  1941/03/23  DATE OF CONSULTATION:  02/07/2012  REFERRING PHYSICIAN:  Dr. Ginette Pitman  CONSULTING PHYSICIAN:  Heinz Knuckles. Deidra Spease, MD  REASON FOR CONSULTATION: Gram-negative rod sepsis.   HISTORY OF PRESENT ILLNESS: The patient is Derrick [name] with Derrick past history significant for vasculitis on immunosuppression, myoclonal gammopathy of certain significance and renal insufficiency who was admitted on 06/24 with altered mental status. At that time he was found to have an elevated temperature of 103.7. He had leukopenia of 2.1 initially but subsequently his white count has increased. The patient was felt to be septic and eventually required intubation and pressors. Per nursing he reportedly had Derrick Foley catheter placed in the nursing home with the balloon inflated within the urethra and had local trauma. On admission his urinalysis was frankly bloody and Derrick urine culture on admission grew ESBL producing Escherichia coli and MRSA. Blood cultures from admission are growing four out of four bottles positive for ESBL producing Escherichia coli. Derrick repeat urine again grew ESBL producing Escherichia coli but not MRSA on 06/24. He was originally treated with vancomycin, Zosyn, and ceftriaxone. The Zosyn was continued until yesterday when the ESBL was identified and he was changed to meropenem. He has continued to receive the vancomycin as well. The patient remains intubated and unresponsive and is unable to provide any history.   ALLERGIES: None.  PAST MEDICAL HISTORY:  1. 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 His ANCA is negative. He is on high dose steroids and Cytoxan.  2. Myoclonal gammopathy of uncertain significance.  3. Gout.  4. Renal failure.  5. Diabetes.  6. Hypertension.  7. Congestive heart failure.  8. Reflux.  9. Hypercholesterolemia.  10. Anxiety.  11. Osteoarthritis.   SOCIAL HISTORY: The patient lives in [facility] nursing home. He does  not smoke nor does he drink.   FAMILY HISTORY: Positive for diabetes.   REVIEW OF SYSTEMS: Unable to obtain from the patient due to his current clinical status.   PHYSICAL EXAMINATION:   VITAL SIGNS: T-max of 103.7 in the ER and 100.1 on the floor, T-current is 98.4, pulse 86, blood pressure 122/58, 99% on the ventilator.   Vent settings include assist control with Derrick rate of 18, spontaneous rate of 8, tidal volume returned of 575, minute ventilation of 11.0, FiO2 of 35%, PEEP of 5.   GENERAL: 74 year old critically ill appearing black [name] on the ventilator who is unresponsive.   HEENT: Normocephalic, atraumatic. Pupils appeared to be equally reactive. Unable to assess extraocular motion. Sclerae, conjunctivae, and lids are without evidence for emboli or petechiae. Oropharynx the patient had an OG tube and endotracheal tube in place. No further exam was possible.   NECK: Midline trachea. No lymphadenopathy. No thyromegaly.   LUNGS: Clear to auscultation bilaterally with good air movement. No focal consolidation.   CARDIAC: Regular rate and rhythm without murmur, rub, or gallop.   ABDOMEN: Soft, nondistended. No hepatosplenomegaly. No hernia is noted.   EXTREMITIES: No evidence for tenosynovitis.   SKIN: No rashes. No stigmata of endocarditis, specifically no Janeway lesions or Osler nodes.   NEUROLOGIC: The patient was unresponsive on the ventilator.   PSYCHIATRIC: Unable to assess.   LABORATORY DATA: BUN 31, creatinine 2.22, potassium 3.5, bicarbonate 21, anion gap 8. LFTs from admission were unremarkable. White count today was 23.0 with Derrick hemoglobin of 9.4, platelet count of 26, ANC of 15.7. White count was 2.3 on admission but  the following day was 10.7 and has steadily been rising. Urinalysis was frankly bloody with 272,000 red cells per high-powered field and 505 white cells per high-powered field. Urine culture is growing MRSA and ESBL producing Escherichia coli. Blood cultures  from admission are growing four out of four bottles positive for ESBL producing Escherichia coli. Repeat urine culture on 06/24 is growing only ESBL producing Escherichia coli. The urinalysis on 0624 was frankly bloody with 3805 red cells and 2201 white cells.   Derrick chest x-ray from admission showed pulmonary edema.   Derrick CT scan of the head without contrast showed some atrophy but no acute intracranial findings.  Chest x-ray from yesterday showed some cardiomegaly and Derrick possible small left pleural effusion. There was no note of any infiltrates.   IMPRESSION: This is Derrick [name] with Derrick history of vasculitis on immunosuppression, MGUS, and renal insufficiency who was admitted with ESBL producing Escherichia coli sepsis, respiratory failure, and MRSA in the urine.   RECOMMENDATIONS:  1. Carbapenems are the drug of choice for ESBL producing gram-negative rod infections. Will change him to ertapenem as daily dosing is easier.  2. Would continue vent support as needed.  3. Continue pressors as needed.  4. Unclear what to make of MRSA in the urine. He clearly is septic from the gram-negative rods in the blood. The nurses report that he had Foley trauma at the nursing home. The MRSA could be Derrick contaminant or could have caused local infection. Will plan on treating for seven days with vancomycin.  5. He remains critically ill and is on high dose steroids. He has Derrick high mortality risk.    TIME SPENT: Approximately 35 minutes was spent providing critical care to this patient. Thank you very much for involving me in Mr. Skelton' care.   ____________________________ Heinz Knuckles. Annina Piotrowski, MD meb:drc D: 02/07/2012 11:44:10 ET T: 02/07/2012 12:04:56 ET JOB#: [id number] Breeann Reposa E Cabot Cromartie MD ELECTRONICALLY SIGNED 02/08/2012 15:09

## 2014-12-05 NOTE — Consult Note (Signed)
PATIENT NAME:  Derrick [name], Derrick [name] MR#:  [id number] DATE OF BIRTH:  May 31, 1941  DATE OF CONSULTATION:  02/04/2012  REFERRING PHYSICIAN:   CONSULTING PHYSICIAN:  Algernon Huxley, MD  REASON FOR CONSULTATION: Need for IV access and dialysis catheter placement.   HISTORY OF PRESENT ILLNESS: The patient has chronic kidney disease who was admitted with hypotension, acidosis, and respiratory insufficiency. He now seems to have progressed to end-stage renal failure and will require CRRT due to his unstable nature with hypotension. His fistula which is present will not be useful for this and we are asked to place a dialysis catheter. In addition, he has limited IV access and we are asked to place a central line as well. He is critically ill in the Critical Care Unit and cannot provide any history so this was obtained from the previous medical record.   PAST MEDICAL HISTORY:  1. Chronic kidney disease with apparent progression to end-stage renal disease.  2. Gout.  3. Monoclonal gammopathy.  4. Diabetes.  5. Hypertension.  6. Obesity.  7. Congestive heart failure.  8. Gastroesophageal reflux disease.  9. Anxiety. 10. Hyperlipidemia. 11. Anemia of chronic disease.   PAST SURGICAL HISTORY:  1. Bilateral cataract surgery.  2. Dialysis access, left arm.   SOCIAL HISTORY: He resides in a nursing home. No alcohol or tobacco use.   FAMILY HISTORY: Mother has diabetes.   ALLERGIES: Apparently to NSAIDs.   MEDICATIONS: His medication list is extensive and please see the previous history and physical for that as this is where it is obtained from.  REVIEW OF SYSTEMS: Unobtainable due to the gravity of his illness.   PHYSICAL EXAMINATION:   GENERAL: This is a critically ill-appearing African American male in the Critical Care Unit.   VITAL SIGNS: Temperature 98.1, pulse 76, blood pressure 84/52, and saturations are in the mid 90s on high flow nasal cannula.   HEAD: Normocephalic and atraumatic.    EYES: Sclera anicteric. Conjunctivae are clear.   EARS: Normal external appearance. Unable to assess hearing.   NECK: Supple. No adenopathy or jugular venous distention.   HEART: Regular rate and rhythm. No clear rubs or murmurs.   LUNGS: Diminished and coarse bilaterally.   ABDOMEN: Soft, nondistended, and nontender.   EXTREMITIES: He has a patent left brachiocephalic AV fistula with a good thrill. His lower extremities have moderate edema. He has no palpable pedal pulses, although his feet are warm with good capillary refill.   SKIN: Warm and dry.   LABORATORY EVALUATION: Sodium 138, potassium 5.0, chloride 107, CO2 17, BUN 104, creatinine 5.4, and glucose 84. White blood cell count is 10.7, hemoglobin 8.9, and platelet count 108,000.   ASSESSMENT AND PLAN: This is a 74 year old African American male with impending respiratory failure. He is about to be intubated. He is critically ill and hypotensive in the Critical Care Unit. He will require CRRT for dialysis and he cannot tolerate hemodialysis through his fistula at this time. As well he needs pressors and other intravenous agents and a central line has been requested. These will be done at the bedside.   This is a level-3 consultation.  ____________________________ Algernon Huxley, MD jsd:slb D: 02/21/2012 13:29:00 ET T: 02/21/2012 14:24:03 ET JOB#: [id number]  cc: Algernon Huxley, MD, <Dictator> Algernon Huxley MD ELECTRONICALLY SIGNED 02/25/2012 12:05

## 2014-12-05 NOTE — Consult Note (Signed)
Pt seen and examined. See Dawn Harrrison's notes. Pt not stable for PEG placement right now. Condition needs to improve 1st. There is no point in placing G tube if patient expires soon. Thanks  Electronic Signatures: Verdie Shire (MD)  (Signed on 05-Jul-13 19:59)  Authored  Last Updated: 05-Jul-13 19:59 by Verdie Shire (MD)

## 2014-12-05 NOTE — Consult Note (Signed)
PATIENT NAME:  Derrick [name], Derrick [name] MR#:  [id number] DATE OF BIRTH:  Jan 10, 1941  DATE OF CONSULTATION:  02/10/2012  REFERRING PHYSICIAN:  Ramonita Lab, MD CONSULTING PHYSICIAN:  Euriah Matlack K. Manuella Ghazi, MD  REASON FOR CONSULTATION: The patient is not waking up off sedation.   HISTORY OF PRESENT ILLNESS: Mr. Derrick [name] is a 74 year old African American gentleman who was in a nursing home. He was brought to the ER due to altered mental status, found to have a urinary tract infection and then developed sepsis and septic shock and systemic inflammatory response syndrome. He also had atrial flutter.  The patient had significant hematuria and Foley was placed by urology with the help of cystoscopy. The patient also developed renal failure, acute on chronic, and has been dialyzed. The patient was decreased on sedation but he was not waking up so neurology consult has been called.   I obtained the information from the chart review as well as talking to the patient's daughter.   The patient had two CT scans of the head which were unremarkable.   REVIEW OF SYSTEMS: Unobtainable.   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 History of diabetes, hypertension, glaucoma, obesity, diastolic congestive heart failure, bilateral knee pain, edema right tibial plateau on MRI, possible avascular necrosis, gastroesophageal reflux disease, hyperlipidemia, anxiety, anemia, and osteoarthritis.   PAST SURGICAL HISTORY: Bilateral cataract surgery and AV fistula placement on the left forearm.    SOCIAL HISTORY: Significant that he is a nursing home resident. No history of alcohol, tobacco, or recreational drug use. He used to be a [employer]   FAMILY HISTORY: Significant for diabetes in mother.   ALLERGIES:  NSAIDs, reaction is unknown.   MEDICATIONS: I reviewed  his home medication list as well as the medications from the hospital.   PHYSICAL EXAMINATION:  VITAL SIGNS: Temperature 98.9, pulse 66, respiratory rate 16, blood pressure 134/58, pulse oximetry 99%.   GENERAL: Elderly looking African American gentleman lying in the bed, intubated, very minimal sedation with Versed.   He has a sebaceous cyst on his scalp.   The daughter is at bedside who had noted that the patient had opened his eyes 2 times today as well as spontaneous movement of the left lower extremity.   The nurse has not noticed any spontaneous movement and when he opens his eyes he does not focus and does not track.   The patient was overbreathing the vent but then he got tired out and now he is not.   His pupils are very minimally to barely reactive?.   He does have barely positive vestibuloocular reflex.   He has a very poor cough. He has generalized hypertonia.   The patient did not respond to painful stimuli to bilateral upper extremities but he did withdraw to pain in his bilateral lower extremities which are quite edematous.   He has an AV fistula in his left forearm.   His toes were mute.   Further neurological examination is not possible due to his level of alertness.   LABORATORY, DIAGNOSTIC, AND RADIOLOGICAL DATA: CT scan of the head was unremarkable.    ASSESSMENT AND PLAN:  PROBLEM #1: Inability to wake up with decreased level of sedation: Likely from his multiple medical problems as well as sedating medication: It might take a longer time for him to get cleared due to his renal failure.  We will check ammonia. Consider checking other liver function tests.   I will hold off on ordering EEG as I have low suspicion that he has any epileptic seizures going on, but subclinical seizures are very hard to identify without an EEG, so if he does not improve down the road it can be considered.   The patient has several reasons to have encephalopathy due severe acidosis,  etc. The patient also has acute on chronic renal failure. There was a concern for DIC.   The patient had recent septic shock but there is no known period of prolonged cerebral hypoperfusion but he was on Levophed.   PROBLEM #2: Acute on chronic renal failure on hemodialysis and IV albumin.   The patient with urinary tract infection, urethral damage from Foley catheter. Urology is following. He is off Coumadin now. He had cystoscopic guided Foley catheter placement.   There was concern that the patient had DIC, thrombocytopenia, and anemia. Hematology was following.   I talked to the daughter to get his functional baseline. He has not walked in one year. The daughter felt his cognition was okay. He was able to remember remote information very well such as from his childhood and his daughter's childhood, but she is not sure about recent memory capacity.   He has not been driving for one year. He has been in the nursing home for "rehab" per daughter.   He used to be a Development worker, community.   I discussed this patient with the family.      Feel free to contact me with any further questions. I will follow this patient on a less frequent basis.  I will also order vitamin B12.    ____________________________ Jo-Anne Kluth K. Manuella Ghazi, MD hks:bjt D: 02/10/2012 41:42:39 ET T: 02/11/2012 09:27:59 ET JOB#: [id number]  cc: Christene Pounds K. Manuella Ghazi, MD, <Dictator> Royetta Crochet [facility] MD ELECTRONICALLY SIGNED 02/27/2012 8:04

## 2014-12-05 NOTE — Consult Note (Signed)
PATIENT NAME:  Derrick [name], Derrick [name] MR#:  [id number] DATE OF BIRTH:  May 13, 1941  DATE OF CONSULTATION:  02/15/2012  REFERRING PHYSICIAN:  Dr. Mortimer Fries  CONSULTING PHYSICIAN:  Payton Emerald, NP / Dr. Verdie Shire  REASON FOR CONSULTATION: PEG tube placement.    HISTORY OF PRESENT ILLNESS: Mr. Meriwether is a 74 year old African American gentleman who presented to [facility] Emergency Room for the concern of altered mental status. He additionally presented with a fever, temperature was 102 on record. He was very lethargic, low blood sugar at 30. Temperature in the Emergency Room was 103.7. Blood pressure was 149/63, respirations 22. A CT of head showed no acute changes. EKG showed normal sinus rhythm and ABG showed mild hypoxia. During his hospitalization he has been diagnosed with sepsis. Blood culture revealed evidence of Escherichia coli gram negative rod. Urine culture revealed 50,000 of Escherichia coli as well as greater than 100,000 of methicillin-resistant staph aureus. Culture also revealed evidence of extended spectrum beta lactamase. History and not able to be obtained from patient at this time; patient is on the ventilator. He received a tracheostomy today. Currently receiving dialysis. Family members are not present during interviewing process. Palliative services involved in patient's care. Review of note 02/12/2012. It was discussed with the family consideration of the possible need for reintubation if patient was to be extubated. Daughter advised that she wished for patient to be extubated with the understanding of the need for reintubation. This was done and as previously stated, patient did have tracheostomy today. Currently his feedings are being held.   PAST MEDICAL HISTORY:  1. History of gout.  2. History of monoclonal gammopathy of unknown significance.  3. Acute renal failure. 4. 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  5. History of diabetes. 6. Hypertension.   7. Glaucoma.  8. Obesity.  9. Diastolic congestive heart failure with anasarca. 10. Bilateral knee pain, edema.  11. 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  12. Reflux. 13. Hyperlipidemia. 14. Anxiety.  15. Anemia.  16. Osteoarthritis.   PAST SURGICAL HISTORY:  1. Bilateral cataract surgery. 2. AV fistula placement left arm.   FAMILY HISTORY: Mother with diabetes.   SOCIAL HISTORY: Home resident. No alcohol, tobacco or recreational drug use.   ALLERGIES: Nonsteroidals.   HOME MEDICATIONS:  1. Metoprolol 50 mg 3 times a day. 2. Protonix 40 mg a day.  3. Prednisone 5 mg a day. 4. Warfarin 6 mg daily.  5. Spironolactone 25 mg daily. 6. Lantus 8 units in the a.m. and 4 units in the p.m.  7. Aspirin 81 mg a day. 8. Atropine drops one drop into the left eye twice daily. 9. Combivent eyedrops 0.2% to 0.5% right eye twice daily. 10. Uloric tablet 80 mg daily. 11. Refresh drops 0.5% to both eyes twice daily.  12. Prednisone drop suspension 1%.  13. Vitamin D3 1000 units once a day.  14. Ferrous sulfate 325 mg twice daily.  15. Latanoprost 0.05% one drop each eye at bedtime. 16. Robitussin cough syrup 5 to 10 mL q.4 hours as needed.   17. Multivitamin 1 tablet once a day. 18. Vitamin C 500 mg twice a day.  19. Pro-Stat 1 tablet twice daily.  20. Norco 5/325 mg 1 tablet every 4 to 6 hours as needed.  21. Hydralazine 100 mg 3 times daily. 22. Neurontin 300 mg twice daily at bedtime.  23. Norvasc 10 mg once a day. 24. Lasix 40  mg once a day to hold if systolic blood pressure is less than 120. 25. MiraLax powder 17 grams daily p.r.n. constipation. 26. Nitroglycerin 0.4 mg p.r.n. every five minutes as needed for chest pain. 27. Procrit 20,000 units injection alternate weekly on Wednesdays. 28. Renvela 800 mg 3 times daily. 29. Tylenol 325 mg 2 tablets every four hours as needed for fever.   PHYSICAL EXAMINATION: VITAL SIGNS: Respirations 14  ventilator-assisted 100% oxygen, blood pressure 144/50, pulse 81, temperature 97.9.   GENERAL: Well-developed, well-nourished 74 year old [place] American male, no acute distress noted. Patient kept sedated currently on vent, tracheostomy placed today.   HEENT: Pupils equal, reactive. Sclerae anicteric.   NECK: Supple. Trachea midline. Tracheostomy in place. No evidence of JVD.   PULMONARY: Symmetric rise and fall of chest. Clear to auscultation anteriorly.   CARDIOVASCULAR: Regular rate, rhythm, S1, S2. No murmurs, no gallops.   ABDOMEN: Soft, nondistended. Bowel sounds hypoactive. Rectal tube in place. No bruits. No masses. No evidence of hepatosplenomegaly.    EXTREMITIES: No edema. No contractures. No clubbing. AV shunt placed to left upper extremity receiving dialysis during interviewing process.   SKIN: Warm, dry. No lesions. No rashes. No evidence of jaundice.   NEUROLOGIC: Unable to truly assess due to sedated state.   LABORATORY, DIAGNOSTIC AND RADIOLOGICAL DATA: All laboratory results reviewed since admission. Today's specifically: Chemistry panel glucose 113, BUN 75, creatinine 4.16, sodium 135, chloride is low at 96, calcium 8.1, otherwise within normal limits. Hepatic panel: Total protein 6.2, albumin 2.5, total bilirubin 1.1, otherwise within normal limits. CBC: RBC 2.72, hemoglobin 7.6, hematocrit 32.5, RDW elevated at 18.6. Differential neutrophil number 7.1 with lymphocyte number 0.9. ABGs on 07/01 pH 7.45, bicarbonate 29.2 with pO2 85. Chest, single view, done for respiratory failure 02/15/2012 in comparison to 07/01 imaging study: Inflation of both lungs has improved since 07/01. The retrocardiac region remains dense and left hemidiaphragm remains obscured.   IMPRESSION:  1. Admitted for altered mental status and sepsis and respiratory failure.  2. History of chronic kidney disease, dialysis dependent. 3. GI consultation requested for PEG tube placement.   PLAN:  Patient's presentation discussed with Dr. Verdie Shire. Will not proceed with PEG tube placement today. Timeframe to be decided once patient's condition has stabilized. Do recommend resuming feeding tubes.  These services were provided by myself under collaborative agreement with Dr. Verdie Shire.   ____________________________ Payton Emerald, NP dsh:cms D: 02/15/2012 12:41:27 ET T: 02/15/2012 15:22:18 ET JOB#: [id number]  cc: Payton Emerald, NP, <Dictator>  Payton Emerald MD ELECTRONICALLY SIGNED 02/15/2012 17:04

## 2014-12-05 NOTE — H&P (Signed)
PATIENT NAME:  Derrick [name], Derrick [name] MR#:  [id number] DATE OF BIRTH:  04-24-1941  DATE OF ADMISSION:  02/04/2012  PRIMARY CARE PHYSICIAN: Dr. [facility]  ER PHYSICIAN: Dr. Renee Ramus ADMITTING PHYSICIAN: Dr. Pearletha Furl  PRESENTING COMPLAINT: Altered mental status.   HISTORY OF PRESENT ILLNESS: Patient is a 74 year old man who was brought in from the nursing home with complaint of altered mental status with fever. Temperature of 102 was recorded. Patient felt lethargic. Had low blood glucose, given some glucose drinks but with no improvement was brought to the Emergency Room. Here temperature was 103.7, blood pressure 149/63, respiratory rate 22 and workup included a CT head which showed no acute changes. EKG which showed normal sinus rhythm, ABG which shows mild hypoxia with pO2 of 70. CBC: White count 2, hemoglobin 10 down from 12 from one month ago, platelets 136. Chemistry: Creatinine 4.8 from baseline of 3.1. INR 2.5. BNP 11602. Negative troponin. Urinalysis shows gross hematuria with white cells more than 505. For this he was referred to hospitalist service for further evaluation. At this time his blood pressure has been dropping down to 94/50. No record of change in medications, long distance travel or any invasive procedures from the nursing home.   REVIEW OF SYSTEMS: Unobtainable due to patient's obtunded condition, unresponsive.   PAST MEDICAL HISTORY:  1. History of gout. 2. Monoclonal gammopathy of unknown significance. 3. Acute renal failure. 4. 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 Status post high dose steroids and Cytoxan. 5. History of diabetes.  6. Hypertension.  7. Glaucoma. 8. Obesity. 9. Diastolic congestive heart failure with anasarca, BNP greater than 11,000. 10. Bilateral knee pain, edema.  11. 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  12. Gastroesophageal reflux disease.  13. Hyperlipidemia.  14. Anxiety. 15. Anemia.  16. Osteoarthritis.     PAST SURGICAL HISTORY:  1. Bilateral cataract surgery. 2. AV fistula placement in left forearm.   SOCIAL HISTORY: Nursing home resident. No history of alcohol, tobacco, or recreational drug use.   FAMILY HISTORY: Mother has diabetes.   ALLERGIES: Non-steroidal anti-inflammatory medications, unknown reaction.    MEDICATIONS:  1. Metoprolol 50 mg 3 times a day. 2. Protonix 40 mg daily.  3. Prednisone 5 mg daily.  4. Coumadin 6 mg daily.  5. Spironolactone 25 mg daily.  6. Lantus 8 units q.a.m. and 4 units at bedtime.  7. Aspirin 81 mg daily.  8. Atropine drops one drop into left eye twice a day.  9. Combigan eyedrops 0.2 to 0.5% into right eye twice a day.  10. Uloric tablet 80 mg daily. 11. Refresh teardrops 0.5% in both eyes twice a day. 12. Prednisone drops suspension 1%.  13. Vitamin D3 1000 units once a day. 14. Ferrous sulfate 325 mg twice a day. 15. Latanoprost 0.005% one drop both eyes at bedtime.  16. Robitussin cough syrup 5 to 10 mL q.4 p.r.n.  17. Multivitamin 1 tablet once a day. 18. Vitamin C 500 mg twice a day.  19. ProStat 1 tablet twice a day.  20. Norco 5/325, 1 tablet q.4-6 hours. 21. Hydralazine 100 mg 3 times daily.  22. Neurontin 300 mg twice a day at bedtime. 23. Amlodipine 10 mg once a day.  24. Lasix 40 mg once a day, to hold if systolic blood pressure less than 120.  25. MiraLax powder 17 grams daily p.r.n. for constipation.  26. Nitroglycerin 0.4 mg p.r.n. q.5 minutes for chest pain.  27. Procrit 20,000 units injection alternate weekly on  Wednesdays. 28. Renvela 800 mg 3 times a day. 29. Tylenol 325 mg 2 tablets q.4 p.r.n. for fever.   PHYSICAL EXAMINATION:  VITAL SIGNS: Temperature 103.7, pulse 84, respiratory rate 22, blood pressure 149/63 on arrival, currently 94/50, oxygen saturation 99% on 2 liters nasal cannula.   GENERAL: Elderly male lying on the gurney, obese, unresponsive, only groans to noxious stimuli. No respiratory distress at  this time.   HEENT: Pupils reactive to light and accommodation. Mucous membranes pink, moist.   NECK: Supple. No JV distention.   CHEST: Good air entry, transmitted breath sounds. No rhonchi, no rales.   HEART: Regular rate, rhythm. No murmurs.   ABDOMEN: Full, obese, nontender. Bowel sounds hypoactive.   EXTREMITIES: Chronic venostasis changes noted, right leg swelling noted. Peripheral pulses decreased.   NEUROLOGIC: Patient is obtunded, unresponsive, only responds to noxious stimuli by withdrawing, groaning to pain.   LABORATORY, DIAGNOSTIC, AND RADIOLOGICAL DATA: EKG showed normal sinus rhythm, rate of 83, nonspecific ST wave changes. Chest x-ray showed no obvious infiltrates. CT head showed no acute abnormality but shows small vessel ischemic changes. CBC: White count is 2 down from 5.4 from one month ago. Hemoglobin is 10 down from 11.6 from one month ago. Platelets 136, down from 285 from a month ago. Chemistries: Sodium 139, potassium 4.6. Creatinine 4.8, up from 3.1 from one month ago. BUN 103, glucose 80, calcium 8.4. BNP 11602. Albumin 2.9. Normal LFT. CK 265. Troponin 0.04. INR 2.5 with PT 27. ABG: pH 7.34, pCO2 30, pO2 70, bicarbonate 16, sats of 92 on room air. Records shows echocardiogram from one year ago with moderate global hypokinesia, ejection fraction of 35%, mild to moderate mitral regurgitation and mild to moderate tricuspid regurgitation. Urinalysis did not show any leukocyte esterase or nitrite but white count is 545. No bacteria seen. RBC more than 27,000. Lactic acid 3.1.   IMPRESSION:  1. Sepsis, cause likely from urinary source. Lumbar puncture was unable to be done due to elevated INR.  2. Urinary tract infection, likely hemorrhagic cystitis.  3. Altered mental status likely from toxic encephalopathy from the sepsis.  4. Thrombocytopenia likely from the sepsis.  5. Coronary artery disease, stable, rule out ACS.  6. Congestive heart failure, stable. 7. Chronic  kidney disease, acute on chronic.  8. Type 2 diabetes, currently with hypoglycemia.  9. Gastroesophageal reflux disease, stable.  10. History of anxiety noted.   PLAN:  1. Admit to the Intensive Care Unit.  2. Get blood cultures x2. Urine culture. Check TSH. Serial cardiac enzymes. Magnesium, fasting lipid profile.  3. GI prophylaxis with Protonix.  4. Deep vein thrombosis prophylaxis at this time not needed, patient is on Coumadin which is therapeutic.  5. IV fluids for rehydration and if not responsive to consider pressors.  6. Consider renal consult for assistance regarding possible dialysis if patient develops anasarca.  7. Sliding scale insulin. Will hold off on oral agents until blood sugar stabilized.  8. Resume rest of outpatient medications. 9. IV antibiotics with Rocephin and vancomycin.  10. Transfer to Dr. Linton Ham service in the morning.  11. CODE STATUS: FULL CODE.       TOTAL PATIENT CARE TIME: 50 minutes critical care time.  ____________________________ Jules Husbands Pearletha Furl, MD mia:cms D: 02/04/2012 01:04:31 ET T: 02/04/2012 07:33:32 ET JOB#: [id number]  cc: Trenese Haft I. Pearletha Furl, MD, <Dictator> Tracie Harrier, MD  Carola Frost MD ELECTRONICALLY SIGNED 02/05/2012 0:57

## 2014-12-05 NOTE — Consult Note (Signed)
Chief Complaint:   Subjective/Chief Complaint Getting HD now. Will start TF after dialysis finished.   VITAL SIGNS/ANCILLARY NOTES: **Vital Signs.:   11-Jul-13 12:30   Vital Signs Type 1 hr Post Blood   Temperature Temperature (F) 98.6   Celsius 37   Temperature Source tympanic   Pulse Pulse 94   Pulse source if not from Vital Sign Device per cardiac monitor   Respirations Respirations 18   Systolic BP Systolic BP 254   Diastolic BP (mmHg) Diastolic BP (mmHg) 66   Mean BP 99   BP Source  if not from Vital Sign Device non-invasive   Pulse Ox % Pulse Ox % 100   Oxygen Delivery Trach Aerosol Mask; 28%   Brief Assessment:   Cardiac Regular    Respiratory clear BS    Gastrointestinal Normal  active BS. G site intact.   Lab Results: Routine BB:  11-Jul-13 00:58    ABO Group + Rh Type O Positive   Antibody Screen NEGATIVE (Result(s) reported on 21 Feb 2012 at 05:00AM.)   Crossmatch Unit 1 Issued (Result(s) reported on 21 Feb 2012 at 10:44AM.)  Routine Chem:  11-Jul-13 00:58    Phosphorus, Serum  5.1 (Result(s) reported on 21 Feb 2012 at 01:58AM.)   Glucose, Serum 81   BUN  58   Creatinine (comp)  3.63   Sodium, Serum 140   Potassium, Serum  3.1   Chloride, Serum 100   CO2, Serum 29   Calcium (Total), Serum  8.4   Anion Gap 11   Osmolality (calc) 295   eGFR (African American)  18   eGFR (Non-African American)  16 (eGFR values <46m/min/1.73 m2 may be an indication of chronic kidney disease (CKD). Calculated eGFR is useful in patients with stable renal function. The eGFR calculation will not be reliable in acutely ill patients when serum creatinine is changing rapidly. It is not useful in  patients on dialysis. The eGFR calculation may not be applicable to patients at the low and high extremes of body sizes, pregnant women, and vegetarians.)  Routine Hem:  11-Jul-13 00:58    WBC (CBC) 6.2   RBC (CBC)  2.75   Hemoglobin (CBC)  7.6   Hematocrit (CBC)  24.0    Platelet Count (CBC) 230   MCV 87   MCH 27.5   MCHC  31.6   RDW  17.6   Neutrophil % 70.5   Lymphocyte % 17.6   Monocyte % 10.5   Eosinophil % 0.6   Basophil % 0.8   Neutrophil # 4.3   Lymphocyte # 1.1   Monocyte # 0.6   Eosinophil # 0.0   Basophil # 0.1 (Result(s) reported on 21 Feb 2012 at 01:50AM.)   Assessment/Plan:  Assessment/Plan:   Assessment S/P PEG.    Plan Start TF later today. Prefer continuous feedings initially. Will sign off. Call [facility] if there are issues with feeding. Thanks   Electronic Signatures: OVerdie Shire(MD)  (Signed 11-Jul-13 13:10)  Authored: Chief Complaint, VITAL SIGNS/ANCILLARY NOTES, Brief Assessment, Lab Results, Assessment/Plan   Last Updated: 11-Jul-13 13:10 by OVerdie Shire(MD)

## 2014-12-05 NOTE — Consult Note (Signed)
History of Present Illness:   Reason for Consult Anemia and thrombocytopenia, coagulopathy in the setting sepsis    HPI   Patient is a 74 year old male with multiple medical problems who was brought through the emergency room with fevers and altered mental status.  He subsequently intubated and sedated.  His also found to have acute renal failure as well as elevated INR.  Patient was taking Coumadin as an outpatient. Currently patient is intubated and sedated and review of systems is unobtainable.  PFSH:   Additional Past Medical and Surgical History Past medical history: Gout, MGUS, chronic renal insufficiency, vasculitis, diabetes, hypertension, glaucoma, morbid obesity, CHF, GERD, hyperlipidemia, anxiety, anemia, arthritis.  Past surgical history: Cataract surgery, AV fistula placement left arm.  Social history: Patient resides in a nursing home.  No report of alcohol or tobacco use.  Family history: Diabetes.   Review of Systems:   Performance Status (ECOG) 4    Review of Systems   unobtainable.  NURSING NOTES: **Vital Signs.:   26-Jun-13 12:00    Vital Signs Type: Routine    Temperature Temperature (F): 97.7    Celsius: 36.5    Temperature Source: oral    Pulse Pulse: 82    Pulse source: per cardiac monitor    CVP: 12    Respirations Respirations: 18    Systolic BP Systolic BP: 798    Diastolic BP (mmHg) Diastolic BP (mmHg): 53    Mean BP: 71    BP Source: non-invasive    Pulse Ox % Pulse Ox %: 100    Pulse Ox Activity Level: At rest    Oxygen Delivery: Ventilator Assisted    Pulse Ox Heart Rate: 82    Access Pressure: -100    Venous Pressure: 169    Effluent Pressure: 199    Balance Chamber Pressure: 220    Therapy Fluid Rate (L/hr): 1.5    Blood Flow Rate (ml/min): 300    Ultrafiltration Rate (mL/hr): 100   Physical Exam:   Physical Exam General: Critically ill-appearing. HEENT: ET tube in place. Lungs: Chemical breath  sounds. Heart: Regular rate and rhythm. No rubs, murmurs, or gallops. Abdomen: Soft, obese, normoactive bowel sounds. Musculoskeletal: No edema, cyanosis, or clubbing. Neuro: Sedated. Skin: No rashes or petechiae noted. Psych: Intubated and sedated    No Known Allergies:     Protonix 40 mg oral delayed release tablet: 1 tab(s) orally once a day (in the morning). , Active, 0, None   Lantus 100 units/mL subcutaneous solution: 8 unit(s) subcutaneous once a day (in the morning)., Active, 0, None   Atropine Care 1% ophthalmic solution: 1 drop(s) to the left eye 2 times a day at 8am and 9pm  **wait 5 mins between eye drops** , Active, 0, None   Combigan 0.2%-0.5% ophthalmic solution: 1 drop(s) to the right eye 2 times a day   **wait 55mns between eye drops**, Active, 0, None   Pred Forte 1% ophthalmic suspension: 1 drop(s) to the left eye 2 times a day    **wait 579ms btwn eye drops**, Active, 0, None   Refresh ophthalmic solution: 1 drop(s) to both eyes 2 times a day. *wait 5 minutes between eye drops*, Active, 0, None   MiraLax oral powder for reconstitution: Mix 17 grams in 4-8oz of liquid of choice and drink orally once daily , Active, 0, None   Procrit 20,000 units/mL injectable solution: Inject every other week on Wednesday. , Active, 0, None   Vitamin C 500 mg oral  tablet: 1 tab(s) orally 2 times a day , Active, 0, None   hydrALAZINE 100 mg oral tablet: 1 tab(s) orally 3 times a day , Active, 0, None   amlodipine 10 mg oral tablet: 1 tab(s) orally once a day, Active, 0, None   aspirin 81 mg oral delayed release tablet: 1 tab(s) orally once a day, Active, 0, None   cyanocobalamin 1000 mcg/mL injectable solution: 1 milliliters (104mg) injectable once a month on the 9th., Active, 0, None   Lasix 40 mg oral tablet: 1 tab(s) orally once a day.  **Hold is SBP <120**, Active, 0, None   multivitamin: 1 tab(s) orally once a day., Active, 0, None   predniSONE 5 mg oral tablet: 1  tab(s) orally once a day , Active, 0, None   spironolactone 25 mg oral tablet: 1 tab(s) orally once a day., Active, 0, None   Uloric 80 mg oral tablet: 1 tab(s) orally once a day., Active, 0, None   Vitamin D3 1000 intl units oral tablet: 1 tab(s) orally once a day., Active, 0, None   ferrous sulfate 325 mg (65 mg elemental iron) oral delayed release tablet: 1 tab(s) orally 2 times a day, Active, 0, None   gabapentin 100 mg oral capsule: 1 cap(s) orally 2 times a day , Active, 0, None   Metoprolol Tartrate 50 mg oral tablet: 1 tab(s) orally 3 times a day , Active, 0, None   Renvela 800 mg oral tablet: 1 tab(s) orally 3 times a day , Active, 0, None   gabapentin 600 mg oral tablet: 1 tab(s) orally once a day (at bedtime), Active, 0, None   Coumadin 5 mg oral tablet: 1 tab(s) orally once a day along with 0.554m=5.1m34motal, Active, 0, None   Coumadin 1 mg oral tablet: 0.5 tab (0.1mg49mrally once a day along with 1mg 72m=5.1mg t40ml, Active, 0, None   Xalatan 0.005% ophthalmic solution: 1 drop(s) to right eye once a day (at bedtime) ** wait 1mins 52mn drops**, Active, 0, None   MiraLax oral powder for reconstitution: 17 gram(s) in liquid of choice and drink orally once a day as needed for constipation , Active, 0, None   NovoLog 100 units/mL subcutaneous solution: Sliding Scale Before Meals:  0-200=0 Units  201-250=2 Units  251-300=4 Units  301-350=6 Units  351-400=8 Units 401-450= 10 units  Call MD if >400 , Active, 0, None   Norco 5 mg-325 mg oral tablet: 1 tab(s) orally every 6 hours, As Needed- for Pain , Active, 0, None   nitroglycerin 0.4 mg sublingual tablet: 1 tab(s) sublingual every 5 minutes, As Needed- for Chest Pain *max 3 doses*, Active, 0, None   Tylenol 325 mg oral tablet: 2 tab(s) orally every 4 hours, As Needed- for Pain , for Fever , Active, 0, None  Laboratory Results: Routine Chem:  26-Jun-13 05:59    Magnesium, Serum  1.5 (1.8-2.4 THERAPEUTIC RANGE: 4-7  mg/dL TOXIC: > 10 mg/dL  -----------------------)   Phosphorus, Serum  1.6 (Result(s) reported on 06 Feb 2012 at 06:33AM.)   Result Comment INR - RESULTS VERIFIED BY REPEAT TESTING.  - NOTIFIED OF CRITICAL VALUE  - C/ ERIN MAYNOR @0632  02-06-12. [facility]  - READ-BACK PROCESS PERFORMED.  Result(s) reported on 06 Feb 2012 at 06:33AM.   Glucose, Serum  137   BUN  35   Creatinine (comp)  2.36   Sodium, Serum 138   Potassium, Serum 3.6   Chloride, Serum 103   CO2, Serum  26   Calcium (Total), Serum  7.8   Anion Gap 9   Osmolality (calc) 286   eGFR (African American)  31   eGFR (Non-African American)  27 (eGFR values <24m/min/1.73 m2 may be an indication of chronic kidney disease (CKD). Calculated eGFR is useful in patients with stable renal function. The eGFR calculation will not be reliable in acutely ill patients when serum creatinine is changing rapidly. It is not useful in  patients on dialysis. The eGFR calculation may not be applicable to patients at the low and high extremes of body sizes, pregnant women, and vegetarians.)  Routine Coag:  26-Jun-13 05:59    Prothrombin  60.3   INR  7.1 (INR reference interval applies to patients on anticoagulant therapy. A single INR therapeutic range for coumarins is not optimal for all indications; however, the suggested range for most indications is 2.0 - 3.0. Exceptions to the INR Reference Range may include: Prosthetic heart valves, acute myocardial infarction, prevention of myocardial infarction, and combinations of aspirin and anticoagulant. The need for a higher or lower target INR must be assessed individually. Reference: The Pharmacology and Management of the Vitamin K  antagonists: the seventh ACCP Conference on Antithrombotic and Thrombolytic Therapy. CHWTUU.8280Sept:126 (3suppl): [id number] A HCT value >55% may artifactually increase the PT.  In one study,  the increase was an average of 25%. Reference:  "Effect on Routine and  Special Coagulation Testing Values of Citrate Anticoagulant Adjustment in Patients with High HCT Values." American Journal of Clinical Pathology 2006;126:400-405.)  Routine Hem:  26-Jun-13 05:59    WBC (CBC)  22.7   RBC (CBC)  3.43   Hemoglobin (CBC)  9.3   Hematocrit (CBC)  29.9   Platelet Count (CBC)  36   MCV 87   MCH 27.2   MCHC  31.1   RDW  18.6   Neutrophil % 93.9   Lymphocyte % 1.2   Monocyte % 3.0   Eosinophil % 1.9   Basophil % 0.0   Neutrophil #  21.3   Lymphocyte #  0.3   Monocyte # 0.7   Eosinophil # 0.4   Basophil # 0.0   Assessment and Plan:  Impression:   Anemia and thrombocytopenia, coagulopathy in the setting of sepsis.  Plan:   1.  Coagulopathy: Coumadin has been stopped and would recommend limiting the use of heparinized products.  Agree with 5 mg PO vitamin K in this setting with no acute bleed.  Will consider repeating dose in the morning if no significant change in INR. Thrombocytopenia: Likely due to underlying sepsis, DIC labs have been ordered.  The time course does not fit for HITT, but will order HITT panel for completeness.  No indication for transfusion at this time. Anemia: Likely multifactorial.  No indication for transfusion at this time, would consider one unit of packed red blood cells if hemoglobin falls below 7.0. consult, will follow.  Electronic Signatures: FDelight Hoh(MD)  (Signed 26-Jun-13 14:09)  Authored: HISTORY OF PRESENT ILLNESS, PFSH, ROS, NURSING NOTES, PE, ALLERGIES, HOME MEDICATIONS, LABS, ASSESSMENT AND PLAN   Last Updated: 26-Jun-13 14:09 by FDelight Hoh(MD)

## 2014-12-05 NOTE — Consult Note (Signed)
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  Deep Vein Thrombosis - DVT:   CHF:   Anemia:   Anxiety:   Diabetes Mellitus,Type I (IDD):   Gout:   Renal Failure:   arthritis:   HTN:   reflux:   Hypercholesterolemia:   Right Chest Catheter for Dialysis:   bilateral cataract surgery:   surgery to right eye "to let fluid out":   Home Medications: Medication Instructions Last Modified Date/Time  Protonix 40 mg oral delayed release tablet 1 tab(s) orally once a day (in the morning).  23-Jun-13 22:45  Lantus 100 units/mL subcutaneous solution 8 unit(s) subcutaneous once a day (in the morning). 23-Jun-13 22:45  Atropine Care 1% ophthalmic solution 1 drop(s) to the left eye 2 times a day at 8am and 9pm  **wait 5 mins between eye drops**  23-Jun-13 22:45  Combigan 0.2%-0.5% ophthalmic solution 1 drop(s) to the right eye 2 times a day   **wait 79mns between eye drops** 23-Jun-13 22:45  Pred Forte 1% ophthalmic suspension 1 drop(s) to the left eye 2 times a day    **wait 569ms btwn eye drops** 23-Jun-13 22:45  Refresh ophthalmic solution 1 drop(s) to both eyes 2 times a day. *wait 5 minutes between eye drops* 23-Jun-13 22:45  MiraLax oral powder for reconstitution Mix 17 grams in 4-8oz of liquid of choice and drink orally once daily  23-Jun-13 22:45  Procrit 20,000 units/mL injectable solution Inject every other week on Wednesday.  23-Jun-13 22:45  Vitamin C 500 mg oral tablet 1 tab(s) orally 2 times a day  23-Jun-13 22:45  hydrALAZINE 100 mg oral tablet 1 tab(s) orally 3 times a day  23-Jun-13 22:45  amlodipine 10 mg oral tablet 1 tab(s) orally once a day 23-Jun-13 22:45  aspirin 81 mg oral delayed release tablet 1 tab(s) orally once a day 23-Jun-13 22:45  cyanocobalamin 1000 mcg/mL injectable solution 1 milliliters (100058m injectable once a month on the 9th. 23-Jun-13 22:45  Lasix 40 mg oral tablet 1 tab(s) orally once a day.  **Hold is  SBP <120** 23-Jun-13 22:45  multivitamin 1 tab(s) orally once a day. 23-Jun-13 22:45  predniSONE 5 mg oral tablet 1 tab(s) orally once a day  23-Jun-13 22:45  spironolactone 25 mg oral tablet 1 tab(s) orally once a day. 23-Jun-13 22:45  Uloric 80 mg oral tablet 1 tab(s) orally once a day. 23-Jun-13 22:45  Vitamin D3 1000 intl units oral tablet 1 tab(s) orally once a day. 23-Jun-13 22:45  ferrous sulfate 325 mg (65 mg elemental iron) oral delayed release tablet 1 tab(s) orally 2 times a day 23-Jun-13 22:45  gabapentin 100 mg oral capsule 1 cap(s) orally 2 times a day  23-Jun-13 22:45  Metoprolol Tartrate 50 mg oral tablet 1 tab(s) orally 3 times a day  23-Jun-13 22:45  Renvela 800 mg oral tablet 1 tab(s) orally 3 times a day  23-Jun-13 22:45  gabapentin 600 mg oral tablet 1 tab(s) orally once a day (at bedtime) 23-Jun-13 22:45  Coumadin 5 mg oral tablet 1 tab(s) orally once a day along with 0.5mg48m.5mg 75mal 23-Jun-13 22:45  Coumadin 1 mg oral tablet 0.5 tab (0.5mg) 79mlly once a day along with 5mg ta78m.5mg tot22m23-Jun-13 22:45  Xalatan 0.005% ophthalmic solution 1 drop(s) to right eye once a day (at bedtime) ** wait 5mins bt53mdrops** 23-Jun-13 22:45  MiraLax oral powder for reconstitution 17 gram(s) in liquid of choice and drink orally once a day as needed for constipation  23-Jun-13 22:45  NovoLog 100 units/mL subcutaneous solution Sliding Scale Before Meals: 0-200=0 Units 201-250=2 Units 251-300=4 Units 301-350=6 Units 351-400=8 Units 401-450= 10 units Call MD if >400  23-Jun-13 22:45  Norco 5 mg-325 mg oral tablet 1 tab(s) orally every 6 hours, As Needed- for Pain  23-Jun-13 22:45  nitroglycerin 0.4 mg sublingual tablet 1 tab(s) sublingual every 5 minutes, As Needed- for Chest Pain *max 3 doses* 23-Jun-13 22:45  Tylenol 325 mg oral tablet 2 tab(s) orally every 4 hours, As Needed- for Pain , for Fever  23-Jun-13 22:45   Allergies:  No Known Allergies:   Vital Signs: **Vital  Signs.:   30-Jun-13 21:45   Pulse Ox % Pulse Ox % 99   Oxygen Delivery Ventilator Assisted   Lab Results:  Thyroid:  23-Jun-13 21:18    Thyroid Stimulating Hormone 2.40 (0.45-4.50 (International Unit)  ----------------------- Pregnant patients have  different reference  ranges for TSH:  - - - - - - - - - -  Pregnant, first trimetser:  0.36 - 2.50 uIU/mL)  Hepatic:  28-Jun-13 04:18    Bilirubin, Total  1.9   Alkaline Phosphatase  208   SGPT (ALT) 28 (12-78 NOTE: NEW REFERENCE RANGE 07/06/2011)   SGOT (AST) 28   Total Protein, Serum  5.7  30-Jun-13 04:53    Albumin, Serum  2.6 (Result(s) reported on 10 Feb 2012 at 05:34AM.)  TDMs:  28-Jun-13 11:41    Vancomycin, Trough LAB 19 (Result(s) reported on 08 Feb 2012 at 12:14PM.)  LabUnknown:  23-Jun-13 21:16    Ind. Clindamycin Resistance NEGATIVE  24-Jun-13 09:56    CEFOXITIN S   ESBL POSITIVE   ERTAPENEM1 S  Routine BB:  28-Jun-13 12:49    ABO Group + Rh Type O Positive   Antibody Screen NEGATIVE (Result(s) reported on 08 Feb 2012 at 06:43PM.)    18:43    Platelets (Blood Component) -  Routine Micro:  23-Jun-13 21:16    Tetracycline Sensitivity S   Oxacillin Sensitivity R   Vancomycin Sensitivity S   Linezolid Sensitivity S   Tigecycline Sensitivity S   Cefoxitin Scrn. POSITIVE   Organism 2 >100,000 CFU METHICILLIN RESISTANT STAPH.AUREUS   Ceftazidime Sensitivity R   Culture Comment . ESBL (Extended Spectrum Beta Lactamase)   Gram Stain 1 GRAM NEGATIVE ROD  24-Jun-13 09:56    Organism Name ESCHERICHIA COLI   Organism Quantity >100,000 CFU/ML   Nitrofurantoin Sensitivity S   Cefazolin Sensitivity R   Ampicillin Sensitivity R   Ceftriaxone Sensitivity R   Ciprofloxacin Sensitivity R   Gentamicin Sensitivity S   Imipenem Sensitivity S   Trimethoprim/Sulfamethoxazole Sensitivty R   Lefofloxacin Sensitivity R   Micro Text Report URINE CULTURE   ORGANISM 1                >100,000 CFU/ML ESCHERICHIA COLI    COMMENT                   ESBL (Extended Spectrum Beta Lactamase)   ANTIBIOTIC                    ORG#1     AMPICILLIN                    R         CEFAZOLIN          R         CEFTRIAXONE  R         CIPROFLOXACIN                 R         ERTAPENEM                     S         GENTAMICIN                    S         IMIPENEM                      S         LEVOFLOXACIN     R         NITROFURANTOIN                S         ESBL                          POSITIVE  CEFOXITIN                     S         TRIMETHOPRIM/SULFAMETHOXAZOLE R   Specimen Source INDWELLING CATH   Organism 1 >100,000 CFU/ML ESCHERICHIA COLI   Culture Comment ESBL (Extended Spectrum Beta Lactamase)  Routine Chem:  23-Jun-13 21:16    B-Type Natriuretic Peptide [facility])  [phone] (Result(s) reported on 03 Feb 2012 at 10:00PM.)  25-Jun-13 00:11    Cholesterol, Serum 90   Triglycerides, Serum 157   HDL (INHOUSE)  18   VLDL Cholesterol Calculated 31   LDL Cholesterol Calculated 41 (Result(s) reported on 05 Feb 2012 at 12:45AM.)  28-Jun-13 04:18    Magnesium, Serum 2.1 (1.8-2.4 THERAPEUTIC RANGE: 4-7 mg/dL TOXIC: > 10 mg/dL  -----------------------)  29-Jun-13 00:09    LDH, Serum 141   Ferritin ([facility]) 278 (Result(s) reported on 09 Feb 2012 at 01:27AM.)   Iron Binding Capacity (TIBC)  169   Unbound Iron Binding Capacity 113   Iron, Serum  56   Iron Saturation 33   Folic Acid, Serum 4.2 (Result(s) reported on 09 Feb 2012 at 01:27AM.)  30-Jun-13 04:53    Phosphorus, Serum 3.1 (Result(s) reported on 10 Feb 2012 at 05:34AM.)   Result Comment LABS - This specimen was collected through an   - indwelling catheter or arterial line.  - A minimum of 35ms of blood was wasted prior    - to collecting the sample.  Interpret  - results with caution.  Result(s) reported on 10 Feb 2012 at 05:39AM.   Glucose, Serum  212   BUN  51   Creatinine (comp)  2.61   Sodium, Serum 139   Potassium, Serum 3.5    Chloride, Serum 101   CO2, Serum 31   Calcium (Total), Serum  8.0   Anion Gap 7   Osmolality (calc) 298   eGFR (African American)  27   eGFR (Non-African American)  23 (eGFR values <667mmin/1.73 m2 may be an indication of chronic kidney disease (CKD). Calculated eGFR is useful in patients with stable renal function. The eGFR calculation will not be reliable in acutely ill patients when serum creatinine is changing rapidly. It is not useful in  patients on dialysis. The eGFR calculation may not be applicable to patients at the low and high extremes of body sizes,  pregnant women, and vegetarians.)  Cardiac:  23-Jun-13 21:16    CK, Total  265   CPK-MB, Serum 2.8 (Result(s) reported on 03 Feb 2012 at 10:00PM.)  24-Jun-13 16:09    Troponin I  0.12 (0.00-0.05 0.05 ng/mL or less: NEGATIVE  Repeat testing in 3-6 hrs  if clinically indicated. >0.05 ng/mL: POTENTIAL  MYOCARDIAL INJURY. Repeat  testing in 3-6 hrs if  clinically indicated. NOTE: An increase or decrease  of 30% or more on serial  testing suggests a  clinically important change)  Routine UA:  24-Jun-13 09:56    Color (UA) RED   Clarity (UA) CLOUDY   Glucose (UA) see comment   Bilirubin (UA) see comment   Ketones (UA) see comment   Specific Gravity (UA) see comment   Blood (UA) see comment   pH (UA) see comment   Protein (UA) see comment   Nitrite (UA) SEE COMMENT   Leukocyte Esterase (UA) see comment   RBC (UA) 3805 /HPF   WBC (UA) 2201 /HPF   Bacteria (UA) 3+   Epithelial Cells (UA) NONE SEEN   WBC Clump (UA) PRESENT (Result(s) reported on 04 Feb 2012 at 10:03AM.)  Routine Coag:  23-Jun-13 21:16    Activated PTT (APTT)  36.4 (A HCT value >55% may artifactually increase the APTT. In one study, the increase was an average of 19%. Reference: "Effect on Routine and Special Coagulation Testing Values of Citrate Anticoagulant Adjustment in Patients with High HCT Values." American Journal of Clinical Pathology  6553;748:270-786.)  26-Jun-13 15:06    Fibrin Degradation Products  >10 <40 (Result(s) reported on 06 Feb 2012 at 04:19PM.)   Fibrinogen  739 (A HCT value >55% may artifactually increase the Fibrinogen.  In one study, the increase was an average of 10%. Reference:  "Effect of Routine and Special Coagulation Testing Values of Citrate Anticoagulant Adjustment in Patients with High HCT Values." American Journal of Clinical Pathology 2006;126:400-405.)   D-Dimer, Quantitative  1.77 (If the D-dimer test is being used to assist in the exclusion of DVT and/or PE, note the following:  In various studies concerning the D-dimer methodology (STA Liatest) in use by this laboratory, it has been reported that with a cut-off value of 0.50 ug/mL FEU, the  negative predictive value regarding the exclusion of thrombosis is within the 95-100% range.  In patients with high pre-test probability of DVT/PE the results of the D-dimer test should be correlated with other diagnostic and clinical assessment modalities." Reference: Eagar., 2005.)  29-Jun-13 10:20    Prothrombin  15.8   INR 1.2 (INR reference interval applies to patients on anticoagulant therapy. A single INR therapeutic range for coumarins is not optimal for all indications; however, the suggested range for most indications is 2.0 - 3.0. Exceptions to the INR Reference Range may include: Prosthetic heart valves, acute myocardial infarction, prevention of myocardial infarction, and combinations of aspirin and anticoagulant. The need for a higher or lower target INR must be assessed individually. Reference: The Pharmacology and Management of the Vitamin K  antagonists: the seventh ACCP Conference on Antithrombotic and Thrombolytic Therapy. LJQGB.2010 Sept:126 (3suppl): [id number]. A HCT value >55% may artifactually increase the PT.  In one study,  the increase was an average of 25%. Reference:  "Effect on Routine and Special  Coagulation Testing Values of Citrate Anticoagulant Adjustment in Patients with High HCT Values." American Journal of Clinical Pathology 2006;126:400-405.)  Routine Hem:  30-Jun-13 04:53    WBC (CBC)  16.4  RBC (CBC)  2.98   Hemoglobin (CBC)  8.1   Hematocrit (CBC)  25.9   Platelet Count (CBC)  85   MCV 87   MCH 27.2   MCHC  31.3   RDW  18.6   Neutrophil % 87.7   Lymphocyte % 4.0   Monocyte % 8.2   Eosinophil % 0.0   Basophil % 0.1   Neutrophil #  14.4   Lymphocyte #  0.6   Monocyte #  1.3   Eosinophil # 0.0   Basophil # 0.0   Radiology Results: CT:    23-Jun-13 22:41, CT Head Without Contrast   CT Head Without Contrast    REASON FOR EXAM:    aloc, fevers  COMMENTS:       PROCEDURE: CT  - CT HEAD WITHOUT CONTRAST  - Feb 03 2012 10:41PM     RESULT: CT of the brain without contrast is compared to a previous   similar study dated 26 Dec 2011.    There is mild prominence of the ventricles and sulci. Low-attenuation is   present within the periventricular and subcortical white matter regions.   There is no intracranial hemorrhage, mass, mass effect or midline shift.   No evolving infarct is appreciated. The included sinuses and mastoid air   cells show normal appearing aeration. The calvarium is intact.    IMPRESSION:   1. Changes of atrophy and chronic small vessel ischemic disease. No acute   intracranial abnormality evident.    Thank you for the opportunity to contribute to the care of your patient.      Dictation Site: 1          Verified By: Sundra Aland, M.D., MD    29-Jun-13 10:14, CT Head Without Contrast   CT Head Without Contrast    REASON FOR EXAM:    persistent altered mental status in pt with   coagulopathy and thrombocytopenia  COMMENTS:       PROCEDURE: CT  - CT HEAD WITHOUT CONTRAST  - Feb 09 2012 10:14AM     RESULT: Comparison:  02/03/2012    Technique: Multiple axial images from the foramen magnum to the vertex   were obtained  without IV contrast.    Findings:    There is no evidence for mass effect, midline shift, or extra-axial fluid   collections. There is no evidence for space-occupying lesion,   intracranial hemorrhage, or cortical-based area of infarction.     Periventricular and subcortical hypoattenuation is consistent with   chronic small vessel ischemic disease.     There is a small air fluid level in the right maxillary sinus, which is   nonspecific. This is similar to prior. Small water density mass in the   scalp along the vertex is nonspecific, but may represent a sebaceous cyst.    The osseous structures are unremarkable.    IMPRESSION:    1. No acute intracranial process.  2. Chronic small vessel ischemic disease..   3. Small air fluid level in the right maxillary sinus, which is   nonspecific. Correlate for acute sinusitis.    Dictation Site: 8          Verified By: Gregor Hams, M.D., MD   Impression/Recommendations:  Recommendations:   Please see my dictation for details. 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 Baseline Hasn't walked since 2012 (?) - gout related per daughterCognition at baseline was OK per daughter - was in nursing home for "rehab?" for the opportunity to participate in care of your patient.free to contact me with any further  questions on this pt, I will follow this patient less frequent basis.   Electronic Signatures: Ray Church (MD)  (Signed 30-Jun-13 22:19)  Authored: PAST MEDICAL/SURGICAL HISTORY, HOME MEDICATIONS, ALLERGIES, NURSING VITAL SIGNS, LAB RESULTS, RADIOLOGY RESULTS, Recommendations   Last Updated: 30-Jun-13 22:19 by Ray Church (MD)

## 2014-12-05 NOTE — Consult Note (Signed)
PATIENT NAME:  Derrick [name], Derrick [name] MR#:  [id number] DATE OF BIRTH:  [birth year]  DATE OF CONSULTATION:  02/14/2012  REFERRING PHYSICIAN:  Mariane Duval, MD CONSULTING PHYSICIAN:  Sammuel Hines. Richardson Landry, MD  REASON FOR CONSULTATION: Tracheostomy.   HISTORY OF PRESENT ILLNESS: This is a 74 year old male was admitted to the hospital on 02/04/2012. Brought into from a nursing home with the complaint of altered mental status with fever. At the time of admission he was felt to have sepsis likely from a urinary source. During hospitalization he ultimately required intubation and mechanical ventilation, and ultimately has not been able to be weaned from the vent due to multiple end-stage organ failure. Dr. Mortimer Fries has talked with the family about this, and they have agreed to proceed.   PAST MEDICAL HISTORY:  1. History of gout.  2. Monoclonal gammopathy. 3. Acute renal failure. 4. 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  5. History of diabetes. 6. Hypertension. 7. Glaucoma.  8. Obesity. 9. Diastolic congestive heart failure with anasarca. 10. Gastroesophageal reflux.  11. Hyperlipidemia.  12. Anxiety.  13. Anemia.  14. Osteoarthritis. 15. Intermittent atrial flutter.  PAST SURGICAL HISTORY: 1. Bilateral cataract surgery. 2. AV fistula placement in left forearm.   SOCIAL HISTORY: He is a nursing home resident prior to admission with no history of alcohol, tobacco, or recreational drug use identified.   FAMILY HISTORY: Mother had diabetes.   ALLERGIES: Nonsteroidal anti-inflammatory medications.    MEDICATIONS: Tylenol 650 mg p.o. every four hours p.r.n. pain or temperature. Atropine ophthalmic drops 1 drop left eye b.i.d., Procrit injection 20,000 units weekly. Latanoprost ophthalmic drops one drop right eye at bedtime. Morphine 2 to 4 mg IV every four hours p.r.n. pain. Zofran 4 mg IV push every four hours p.r.n. nausea, vomiting. Combivent inhaler 8 puffs every  four hours. Fluticasone HFA 220 mcg 2 puffs q.12 hours. Transderm scopolamine patch q.3 days. Colace 100 mg p.o. every 12 hours. Invanz 0.5 grams IV q.24 hours. Heparin to hemodialysis catheter during dialysis. Protonix 40 mg IV push daily. Senokot 8.8 mg per 5 mL 10 mL p.o. q.12 hours. Glycopyrrolate 0.4 mg IV push every four hours p.r.n. increased secretions. Sliding scale insulin.  REVIEW OF SYSTEMS: Unobtainable.   PHYSICAL EXAMINATION: VITAL SIGNS: Temperature 99.5, pulse 94, blood pressure 114/51.  GENERAL EXAM: Sedated male, intubated in the unit, unresponsive.   HEAD AND FACE EXAM: Head is normocephalic, atraumatic. He does have what appears to be a 1 cm sebaceous cyst on his scalp with no sign of infection.   EAR EXAM: External ears are unremarkable. Ear canals are impacted with cerumen. I cannot visualize tympanic membranes.   NASAL EXAM: External nose is unremarkable. Nasal cavity is clear. No purulence or polyps are seen.  ORAL CAVITY AND OROPHARYNX: Lips are unremarkable. There is some poor dentition but otherwise oral cavity exam is quite limited due to his intubated state and inability to cooperate with exam.   NECK EXAM: The neck is supple without adenopathy or mass. There is no thyromegaly. There are no scars visualized in the neck. I can palpate laryngotracheal landmarks. His trachea sits a little low in the neck, but I can palpate the cricoid easily. Salivary glands are soft and without masses.  NEUROLOGICAL: Exam unobtainable.    DATA REVIEW: Hemoglobin 7.3, platelet count is normal at 188,000.   ASSESSMENT: This patient has respiratory failure with inability to wean from the ventilator.   PLAN: We will proceed with tracheostomy hopefully  tomorrow. I will discuss the risks of the surgery with the family including bleeding, recurrent laryngeal nerve injury, infection, scarring, and possible long-term need for the tracheostomy.    ____________________________ Sammuel Hines.  Richardson Landry, MD psb:kma D: 02/14/2012 10:20:35 ET T: 02/15/2012 10:09:57 ET JOB#: [id number]  cc: Sammuel Hines. Richardson Landry, MD, <Dictator> Riley Nearing MD ELECTRONICALLY SIGNED 02/27/2012 8:00

## 2014-12-05 NOTE — Op Note (Signed)
PATIENT NAME:  Derrick [name], Derrick [name] MR#:  [id number] DATE OF BIRTH:  02-Oct-1940  DATE OF PROCEDURE:  02/04/2012  PREOPERATIVE DIAGNOSES:  1. Respiratory insufficiency.  2. Renal failure.  3. Hypotension.  4. Poor venous access.   POSTOPERATIVE DIAGNOSES: 1. Respiratory insufficiency.  2. Renal failure.  3. Hypotension.  4. Poor venous access.   PROCEDURES: 1. Ultrasound guidance for vascular access, left jugular vein.  2. Placement of left jugular triple lumen catheter.   SURGEON: Algernon Huxley, MD   ANESTHESIA: Local with lidocaine.   ESTIMATED BLOOD LOSS: Minimal.   INDICATION FOR PROCEDURE: The patient is a 74 year old African American male with renal failure, hypotension requiring pressors, respiratory insufficiency. We have been asked to place a central line.   DESCRIPTION OF PROCEDURE: The patient was laid flat in his critical care bed. His left neck was sterilely prepped and draped and a sterile surgical field was created. The jugular vein was visualized with ultrasound and found to be widely patent. It was accessed under direct ultrasound guidance without difficulty with a Seldinger needle and a J-wire was placed. After skin nick and dilatation, the triple lumen catheter was placed over the wire and the wire was removed.   All three ports withdrew dark red nonpulsatile blood and flushed easily with sterile saline. A stat chest x-ray is pending.   ____________________________ Algernon Huxley, MD jsd:drc D: 02/04/2012 18:39:37 ET T: 02/05/2012 09:31:47 ET JOB#: [id number]  cc: Algernon Huxley, MD, <Dictator> Algernon Huxley MD ELECTRONICALLY SIGNED 02/07/2012 9:24

## 2014-12-05 NOTE — Op Note (Signed)
PATIENT NAME:  Derrick [name], Derrick [name] MR#:  [id number] DATE OF BIRTH:  1941/04/03  DATE OF PROCEDURE:  02/15/2012  PREOPERATIVE DIAGNOSIS: Respiratory failure.   POSTOPERATIVE DIAGNOSIS: Respiratory failure.   PROCEDURE: Elective tracheostomy.   SURGEON: Sammuel Hines. Richardson Landry, MD  ANESTHESIA: General endotracheal.   INDICATIONS: Patient with respiratory failure with inability to wean from the vent.   DESCRIPTION OF PROCEDURE: After obtaining informed consent from the family, the patient was taken to the Operating Room, placed in supine position. He was already endotracheally intubated. After timeout, the neck was injected with 1% lidocaine with epinephrine 1:200,000 and then prepped and draped in the usual sterile fashion. A 15 blade was used to incise the skin and the incision carried down through the subcutaneous fatty tissues with the Harmonic scalpel. The strap muscles were identified and divided in the midline and retracted laterally. Dissection proceeded down to the region of the cricoid and trachea. The trachea was sitting relatively low in the neck. The thyroid isthmus was identified and divided and retracted laterally and the trachea pulled up with a cricoid hook. Anesthesia was then instructed to be ready to pull the endotracheal tube back. A 15 blade was used to incise between the first and second tracheal rings and an inferiorly-based tracheal flap created and secured to the skin with 4-0 Vicryl suture. The endotracheal tube was pulled back and the airway suctioned and a #6 Shiley cuffed endotracheal tube placed and the cuff inflated. The patient was hooked to the anesthesia circuit and CO2 return was confirmed. The tracheostomy flange was then secured to the skin with 2-0 silk suture followed by a trach tie. Blood loss was less than 25 mL. Patient tolerated procedure well. He was taken to the Critical Care Unit in stable condition postoperatively.   ____________________________ Sammuel Hines. Richardson Landry,  MD psb:cms D: 02/15/2012 08:26:17 ET T: 02/15/2012 11:25:47 ET  JOB#: [id number] cc: Sammuel Hines. Richardson Landry, MD, <Dictator> Riley Nearing MD ELECTRONICALLY SIGNED 02/27/2012 8:00

## 2014-12-05 NOTE — Consult Note (Signed)
Brief Consult Note: Diagnosis: Admission for altered mental status.  Sepsis.  Respiratory Failure.  History of CKD.  Diaylsis dependent.  GI consultation for PEG tube placement.   Discussed with Attending MD.   Comments: Patient's presentation discussed with Dr. Verdie Shire.  Will not proceed with PEG tub placement today.  Time frame of when to proceed with be based on once patient's condition has stablized.  Electronic Signatures: Payton Emerald (NP)  (Signed 05-Jul-13 12:41)  Authored: Brief Consult Note   Last Updated: 05-Jul-13 12:41 by Payton Emerald (NP)

## 2014-12-05 NOTE — Consult Note (Signed)
Patient seen, chart reviewed, films reviewed, note dictated Urinary retention, urosepsis, acute on chronic renal failure, urethral false passage, hematuria The Foley catheter placed at the facility was with in the prostatic urethra.  20 mL of fluid was in the balloon.  An 18-French Foley catheter was placed over a guidewire.  Resistance was met at 22-French due to and undetermined source.  Visualization was limited at the time of cystoscopy due to a large amount of blood and clot.  He will have ongoing intermittent bleeding from the urethra related to the trauma and his anticoagulation.  This may be ongoing for up to 2 weeks.  Applying some compression with gauze can be utilized short term.  Intermittent irrigation of the bladder for potential clots is also reasonable.  The overdistention will also lead to bleeding related to that coagulation.  Minimize anticoagulation as much as possible.  If there is significant enough improvement in regards to blood pressure, starting Flomax at some point in the near future should be considered.  He will need an eventual voiding trial.  He should have cystoscopy for further evaluation of the difficulty with catheter placement once the trauma has had a chance to heal.  No further intervention from a urology standpoint is indicated at present.  Will follow with you and make further recommendations if indicated.  Electronic Signatures: Murrell Redden (MD)  (Signed on 24-Jun-13 19:20)  Authored  Last Updated: 24-Jun-13 19:20 by Murrell Redden (MD)

## 2014-12-05 NOTE — Consult Note (Signed)
    Comments   Raquel Rey, RN, and I spoke with pt's daughter/HCPOA, Nanda Quinton. Updated her on pt's current medical condition. Explained that pt would likely be ready for extubation in AM. Discussed reintubation in the event that pt declines after extubation and daughter wants pt to be re-intubated if needed.   Electronic Signatures: Sherrina Zaugg, Izora Gala (MD)  (Signed 01-Jul-13 17:27)  Authored: Palliative Care   Last Updated: 01-Jul-13 17:27 by Irisha Grandmaison, Izora Gala (MD)

## 2014-12-05 NOTE — Consult Note (Signed)
Comments   I met with pt's 2 daughters and pt's sister. Updated them on pt's current medical condition. We discussed extubation and the possibility of need for reintubation. In the event of reintubation, pt would likely need trach/PEG and I explained this in detail to family. They understand that pt would likely need placement in an LTACH if he has trach/PEG and that his additional need for dialysis may make for difficulty with placement. After lengthy discussion, pt's daughter confirms that she still wants reintubation if needed. Pt's sister expresses reservation but defers to daughters. All say that pt had a very good quality of life prior to this illness and that he had expressed the desire to have whatever medical intervention was needed to stay alive.  expressed appreciation for meeting. All questions answered.   Electronic Signatures: Marda Breidenbach, Izora Gala (MD)  (Signed 02-Jul-13 15:46)  Authored: Palliative Care   Last Updated: 02-Jul-13 15:46 by Antwan Pandya, Izora Gala (MD)

## 2014-12-05 NOTE — Consult Note (Signed)
Chief Complaint:   Subjective/Chief Complaint More alert. S/P trach. Sister available at bedside.   VITAL SIGNS/ANCILLARY NOTES: **Vital Signs.:   08-Jul-13 13:38   Vital Signs Type Recheck   Pulse Pulse 92   Pulse source if not from Vital Sign Device per cardiac monitor   Respirations Respirations 18   Systolic BP Systolic BP 468   Diastolic BP (mmHg) Diastolic BP (mmHg) 64   Mean BP 92   Pulse Ox % Pulse Ox % 100   Pulse Ox Activity Level  At rest   Oxygen Delivery Trach Aerosol Mask   Pulse Ox Heart Rate 72   Brief Assessment:   Cardiac Regular    Respiratory clear BS    Gastrointestinal distended but nontender   Lab Results: Routine Chem:  08-Jul-13 05:30    Glucose, Serum  115   BUN  72   Creatinine (comp)  4.18   Sodium, Serum 136   Potassium, Serum  3.1   Chloride, Serum 99   CO2, Serum 30   Calcium (Total), Serum  8.0   Anion Gap 7   Osmolality (calc) 294   eGFR (African American)  15   eGFR (Non-African American)  13 (eGFR values <38m/min/1.73 m2 may be an indication of chronic kidney disease (CKD). Calculated eGFR is useful in patients with stable renal function. The eGFR calculation will not be reliable in acutely ill patients when serum creatinine is changing rapidly. It is not useful in  patients on dialysis. The eGFR calculation may not be applicable to patients at the low and high extremes of body sizes, pregnant women, and vegetarians.)  Routine Hem:  08-Jul-13 05:30    WBC (CBC) 8.7   RBC (CBC)  2.86   Hemoglobin (CBC)  7.8   Hematocrit (CBC)  24.5   Platelet Count (CBC) 281   MCV 86   MCH 27.4   MCHC  31.9   RDW  18.3   Neutrophil % 79.9   Lymphocyte % 11.4   Monocyte % 7.9   Eosinophil % 0.5   Basophil % 0.3   Neutrophil #  7.0   Lymphocyte # 1.0   Monocyte # 0.7   Eosinophil # 0.0   Basophil # 0.0 (Result(s) reported on 18 Feb 2012 at 05:59AM.)   Assessment/Plan:  Assessment/Plan:   Assessment Resp failure. Septic shock.  On trach    Plan Will plan PEG placement on Wed. Make sure to stop TF on Tues PM after MN. Thanks.   Electronic Signatures: OVerdie Shire(MD)  (Signed 08-Jul-13 13:49)  Authored: Chief Complaint, VITAL SIGNS/ANCILLARY NOTES, Brief Assessment, Lab Results, Assessment/Plan   Last Updated: 08-Jul-13 13:49 by OVerdie Shire(MD)

## 2014-12-05 NOTE — Consult Note (Signed)
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 coli sepsis, respiratory failure and Methacillin Resistant Staph aureus in the urine.  Carbapenems are the drug of choice for ESBL producing GNR infections.  Will change him to ertapenem as daily dosing is easier. Continue vent support as needed. Continue pressors as needed. Unclear what to make of the Methacillin Resistant Staph aureus in the urine.  He clearly is septic from the [place] in the blood.  The RN reported that he had foley trauma at the [facility].  The Methacillin Resistant Staph aureus could be a contaminant or could have caused a local infection.  Would treat with the vanco for 7 days total. He remains critically ill and is on high dose steroids.  High mortality risk.  Electronic Signatures: [place] Lemaster, Heinz Knuckles (MD)  (Signed on 27-Jun-13 11:07)  Authored  Last Updated: 27-Jun-13 11:07 by Alexia Dinger, Heinz Knuckles (MD)

## 2014-12-05 NOTE — Consult Note (Signed)
Following course.and cultures noted.  second urine Cx shold be the most accurate since I obtained it at the time of catheter placement.with CT if WBC persists.  Electronic Signatures: Murrell Redden (MD)  (Signed on 28-Jun-13 15:24)  Authored  Last Updated: 28-Jun-13 15:24 by Murrell Redden (MD)

## 2014-12-05 NOTE — Consult Note (Signed)
Course Noted.UOP but minimal bleeding  Electronic Signatures: Murrell Redden (MD)  (Signed on 25-Jun-13 13:17)  Authored  Last Updated: 25-Jun-13 13:17 by Murrell Redden (MD)

## 2014-12-05 NOTE — Consult Note (Signed)
PATIENT NAME:  Derrick [name], Derrick [name] MR#:  [id number] DATE OF BIRTH:  1941-07-09  DATE OF CONSULTATION:  02/05/2012  REFERRING PHYSICIAN:   CONSULTING PHYSICIAN:  Isaias Cowman, MD  PRIMARY CARE PHYSICIAN: Dr. Ginette Pitman   CHIEF COMPLAINT: Altered mental status.   HISTORY OF PRESENT ILLNESS: The patient is a 74 year old gentleman who was admitted on 02/04/2012 with altered mental status, fever, urinary tract infection, and probable urosepsis. During the hospitalization the patient has required intubation and mechanical ventilation. The patient has predominantly been in sinus rhythm although reportedly has had episodes of atrial flutter with rates in the 110 to 130's. The patient is currently on Coumadin and likely has a prior history of atrial fibrillation and is also on metoprolol 50 mg t.i.d. which has been withheld since the patient is intubated. The patient is currently in sinus rhythm.   PAST MEDICAL HISTORY:  1. Probable atrial fibrillation.  2. History of congestive heart failure due to diastolic dysfunction.  3. Hypertension.  4. Diabetes.  5. Hyperlipidemia.  6. Chronic kidney disease.  7. History of gout.  8. Monoclonal gammopathy.   MEDICATIONS ON ADMISSION:  1. Coumadin 6 mg daily.  2. Aspirin 81 mg daily.  3. Metoprolol 50 mg t.i.d.  4. Spironolactone 25 mg daily.  5. Hydralazine 100 mg t.i.d.  6. Amlodipine 10 mg daily.  7. Lasix 40 mg daily.  8. Protonix 40 mg daily.  9. Prednisone 5 mg daily.  10. Lantus 8 units q.a.m., 4 units q. at bedtime.  11. Atropine eyedrops. 12. Combigan eyedrops. 13. Uloric 80 mg daily.  14. Prednisone eyedrops. 15. Vitamin D3 1000 units daily.  16. Ferrous sulfate 325 mg b.i.d.  17. Latanoprost eyedrops. 18. Robitussin cough syrup 5 to 10 mL q.4 p.r.n.  19. Multivite 1 daily.  20. Vitamin C 500 mg b.i.d.  21. Pro-Stat 1 tablet b.i.d.  22. Norco 1 tablet q.4 to 6 hours.  23. Neurontin 300 mg b.i.d.  24. MiraLAX powder 17 grams daily  p.r.n.  25. Procrit 20,000 units injection alternate weeks on Wednesdays. 26. Renvela 800 mg t.i.d.   SOCIAL HISTORY: The patient currently is a resident at [facility]. There is no prior history of tobacco abuse.   FAMILY HISTORY: No immediate family history of coronary disease or myocardial infarction.   REVIEW OF SYSTEMS: The patient is currently intubated. According to the daughter, the patient has had recent decreased mental status with fever. No complaints of chest pain or shortness of breath.   PHYSICAL EXAMINATION:   VITAL SIGNS: Blood pressure 85/54, pulse currently 70, respirations 18, pulse oximetry 99%, temperature 98.2.   HEENT: Pupils equal and reactive to light and accommodation.   NECK: Supple without thyromegaly.   LUNGS: Clear.   CARDIOVASCULAR: Normal JVP. Normal PMI. Regular rate and rhythm. Normal S1, S2. No appreciable gallop, murmur, or rub.   ABDOMEN: Soft, nontender. Pulses were intact bilaterally.   MUSCULOSKELETAL: Normal muscle tone.   NEUROLOGIC: The patient currently is intubated and heavily sedated.   IMPRESSION: This is a 74 year old gentleman who presents with probable urosepsis with intermittent atrial flutter with rates of 110 to 130 bpm predominantly in sinus rhythm, currently not on any oral medications.   RECOMMENDATIONS:  1. Agree with overall current therapy.  2. Would defer full dose anticoagulation at this time.  3. If the patient has problematic and recurrent atrial flutter with a rapid rate, would consider diltiazem drip for rate control.  4. May use Lopressor 2.5 to 5 mg  IV q.6 p.r.n. for rapid ventricular rate while the patient is intubated.  5. Resume metoprolol as previously dosed once the patient is extubated and able to take oral medications.  ____________________________ Isaias Cowman, MD ap:drc D: 02/05/2012 13:28:37 ET T: 02/05/2012 14:07:29 ET JOB#: [id number]  cc: Isaias Cowman, MD, <Dictator> Isaias Cowman MD ELECTRONICALLY SIGNED 02/21/2012 8:38

## 2014-12-05 NOTE — Consult Note (Signed)
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  Needs IV access and intensivist unable to place, so are asked to place central line.  Will also need access for CRRT.  Electronic Signatures: Algernon Huxley (MD)  (Signed on 24-Jun-13 15:49)  Authored  Last Updated: 24-Jun-13 15:49 by Algernon Huxley (MD)

## 2014-12-05 NOTE — Consult Note (Signed)
PATIENT NAME:  Derrick [name], Derrick [name] MR#:  [id number] DATE OF BIRTH:  Sep 08, 1940  DATE OF CONSULTATION:  02/04/2012  REFERRING PHYSICIAN:   CONSULTING PHYSICIAN:  Denice Bors. Jacqlyn Larsen, MD  HISTORY: Derrick [name] is a 74 year old African American gentleman who was a resident of a nearby facility. He was found to have altered mental status and fever. An attempt was made at Foley catheterization prior to transfer to [facility]. No significant urine was returned. A significant amount of blood was noted. On presentation to the Emergency Room, he was noted to have a serum creatinine of 4.8. It has significantly risen since that time. His INR was 3.1. Urology consult was requested for Foley catheter placement due to inability to irrigate or drain urine.   PAST MEDICAL HISTORY:  1. Gout. 2. Monoclonal gammopathy.  3. Chronic renal insufficiency.  4. Vasculitis.  5. Diabetes mellitus.  6. Hypertension.  7. Glaucoma.  8. Obesity.  9. Congestive heart failure.  10. Degenerative joint disease. 11. Gastroesophageal reflux.  12. Hyperlipidemia.  13. Anemia. 14. Osteoarthritis.   PAST SURGICAL HISTORY:  1. AV fistula in the left arm.  2. Bilateral cataract surgery.   SOCIAL HISTORY: Not significant for alcohol, tobacco, or drugs.   ALLERGIES: Nonsteroidal medications.   MEDICATIONS ON PRESENTATION:  1. Metoprolol 50 mg 3 times daily.  2. Protonix 40 mg daily.  3. Prednisone 5 mg daily.  4. Coumadin 6 mg daily.  5. Spironolactone 25 mg daily. 6. Insulin.  7. ASA 81 mg. 8. Atropine twice daily. 9. Combigan twice daily.  10. Uloric 80 mg daily.  11. Vitamin D 1000 units daily. 12. Ferrous sulfate 325 mg twice daily.  13. Multivitamin daily. 14. Vitamin C 500 mg twice daily. 15. Norco 5/325 1 tablet every 4 to 6 hours as needed for pain.  16. Hydralazine 100 mg 3 times daily.  17. Neurontin 300 mg twice daily. 18. Amlodipine 10 mg daily.  19. Lasix 40 mg daily.  20. MiraLAX as needed for  constipation. 21. Nitroglycerin as needed for chest pain. 22. Procrit. 23. Renvela 800 mg 3 times daily.  24. Tylenol as needed.   PHYSICAL EXAMINATION:   GENERAL: The patient is minimally responsive. He was unable to answer questions.  VITAL SIGNS: Temperature at the time of evaluation was 100.1, blood pressure 103/62 on supportive measures, heart rate 68, respiratory rate 15.   HEENT: Otherwise within normal limits.   CHEST: Decreased breath sounds bilaterally.   CARDIOVASCULAR: Regular rate and rhythm.   ABDOMEN: Soft. No palpable masses. Bladder palpable. Mild tenderness in the suprapubic region.   EXTREMITIES: Free range of motion x4.   NEUROLOGIC: Motor and sensory grossly intact. Response, however, was limited due to his septic state.   ASSESSMENT:  1. Urinary retention with sepsis. 2. Acute on chronic renal failure. 3. Hematuria.  4. Urethral false passage.   RECOMMENDATION: The Foley catheter was placed utilizing visual guidance. A 22 French catheter could not be placed due to an undetermined source. An 18 French catheter was placed. A significant amount of clot and debris was noted within the prostatic urethra. Significant trauma was present from balloon inflation within the prostatic urethra. Approximately 20 mL was present in the balloon when deflated. The patient did void a moderate amount of fluid spontaneously after catheter removal. A moderate amount of dark blood was present due to the catheter trauma. More than 1 liter of urine was subsequently drained from the bladder after catheter placement. He will have some ongoing  hematuria related to his anticoagulation. Decrease anti-coagulation as much as possible. Manually irrigate as needed for now. Monitoring over the next few days with urine output and renal function status are all that are indicated for now. He will need eventual cystoscopy. If he continues to improve, the addition of Flomax 0.4 mg will be recommended for  an eventual attempt at voiding trial. He will also need further evaluation with cystoscopy for the potential source for difficult catheter placement and potential need for further intervention. I will follow with you and make further recommendations as indicated. Please notify us if there are any further problems or questions in the interim.   ____________________________ Denice Bors Jacqlyn Larsen, MD bsc:drc D: 02/04/2012 19:38:19 ET T: 02/05/2012 09:54:53 ET JOB#: [id number]  cc: Denice Bors. Jacqlyn Larsen, MD, <Dictator> Denice Bors Kaliegh Willadsen MD ELECTRONICALLY SIGNED 02/07/2012 0:40
